# Patient Record
Sex: Male | Born: 1985 | Race: White | Hispanic: No | Marital: Single | Smoking: Never smoker
Health system: Southern US, Community
[De-identification: ages and names within clinical notes are randomized; demographics above are authoritative.]

## PROBLEM LIST (undated history)

## (undated) DIAGNOSIS — K5090 Crohn's disease, unspecified, without complications: Secondary | ICD-10-CM

## (undated) HISTORY — PX: HERNIA REPAIR: SHX51

## (undated) HISTORY — PX: INCISION AND DRAINAGE PERIRECTAL ABSCESS: SHX1804

---
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
## 2008-05-21 ENCOUNTER — Encounter (HOSPITAL_COMMUNITY): Admission: RE | Admit: 2008-05-21 | Discharge: 2008-08-19 | Payer: Self-pay | Admitting: Gastroenterology

## 2011-05-13 NOTE — Op Note (Signed)
Jesse Hanson, HERDA NO.:  [id number]   MEDICAL RECORD NO.:  [id number]          PATIENT TYPE:  EMS   LOCATION:  ED                           FACILITY:  [facility]   PHYSICIAN:  Lorre Munroe., M.D.DATE OF BIRTH:  1986-07-21   DATE OF PROCEDURE:  04/04/2005  DATE OF DISCHARGE:                                 OPERATIVE REPORT   PREOPERATIVE DIAGNOSIS:  Perirectal abscess.   OPERATION/PROCEDURE:  Incision and drainage of perirectal abscess.   SURGEON:  Lebron Conners, M.D.   ANESTHESIA:  General and local.   DESCRIPTION OF PROCEDURE:  After the patient was monitored and anesthetized  and had routine preparation and draping in the perirectal area and did a  good intra-anal and intrarectal exam, felt very little induration along the  rectal wall.  There was a large perirectal abscess on the left side.   I made a radial incision through it going toward the rectum, right through  the middle of the abscess and drained a large amount of pus.  I put my  finger in and broke up ramifications of the abscess to assure complete  drainage.  It went a long way anteriorly and posteriorly although it did not  reach the posterior midline and did not cross the midline anteriorly either.  I made two small counter incisions and pulled a half-inch Penrose drain  through to assure good drainage of the complete cavity.  After getting good  hemostasis and washing the cavity out, I packed it with gauze and applied a  bulky bandage.  I did not attempt to identify or excise any fistula into the  anorectum as I did not feel that there was one present.      WB/MEDQ  D:  04/04/2005  T:  04/05/2005  Job:  [id number]   cc:   Griffith Citron, M.D.  [facility] [place]  [place] [address]  Fax: [phone]

## 2017-05-01 DIAGNOSIS — L718 Other rosacea: Secondary | ICD-10-CM

## 2017-05-01 HISTORY — DX: Other rosacea: L71.8

## 2019-12-31 DIAGNOSIS — K5080 Crohn's disease of both small and large intestine without complications: Secondary | ICD-10-CM | POA: Diagnosis present

## 2020-03-13 ENCOUNTER — Ambulatory Visit: Payer: Self-pay | Attending: Internal Medicine

## 2020-03-13 DIAGNOSIS — Z23 Encounter for immunization: Secondary | ICD-10-CM

## 2020-03-13 NOTE — Progress Notes (Signed)
   Covid-19 Vaccination Clinic  Name:  Jesse Hanson    MRN: [id number] DOB: Apr 27, 1986  03/13/2020  Mr. [name] was observed post Covid-19 immunization for 15 minutes without incident. He was provided with Vaccine Information Sheet and instruction to access the V-Safe system.   Mr. [name] was instructed to call 911 with any severe reactions post vaccine: Marland Kitchen Difficulty breathing  . Swelling of face and throat  . A fast heartbeat  . A bad rash all over body  . Dizziness and weakness   Immunizations Administered    Name Date Dose VIS Date Route   Pfizer COVID-19 Vaccine 03/13/2020 12:24 PM 0.3 mL 12/06/2019 Intramuscular   Manufacturer: [employer], [employer]   Lot: [id number]   NDC: [address]-7

## 2020-04-07 ENCOUNTER — Ambulatory Visit: Payer: Self-pay | Attending: Internal Medicine

## 2020-04-07 DIAGNOSIS — Z23 Encounter for immunization: Secondary | ICD-10-CM

## 2020-04-07 NOTE — Progress Notes (Signed)
   Covid-19 Vaccination Clinic  Name:  Javius Sylla    MRN: [id number] DOB: 1986/10/07  04/07/2020  Mr. Teaster was observed post Covid-19 immunization for 15 minutes without incident. He was provided with Vaccine Information Sheet and instruction to access the V-Safe system.   Mr. Bones was instructed to call 911 with any severe reactions post vaccine: Marland Kitchen Difficulty breathing  . Swelling of face and throat  . A fast heartbeat  . A bad rash all over body  . Dizziness and weakness   Immunizations Administered    Name Date Dose VIS Date Route   Pfizer COVID-19 Vaccine 04/07/2020  1:24 PM 0.3 mL 12/06/2019 Intramuscular   Manufacturer: [employer], [employer]   Lot: [id number]   NDC: [address]-9

## 2021-06-06 ENCOUNTER — Emergency Department (HOSPITAL_BASED_OUTPATIENT_CLINIC_OR_DEPARTMENT_OTHER): Payer: Managed Care, Other (non HMO)

## 2021-06-06 ENCOUNTER — Encounter (HOSPITAL_BASED_OUTPATIENT_CLINIC_OR_DEPARTMENT_OTHER): Payer: Self-pay | Admitting: Obstetrics and Gynecology

## 2021-06-06 ENCOUNTER — Emergency Department (HOSPITAL_BASED_OUTPATIENT_CLINIC_OR_DEPARTMENT_OTHER)
Admission: EM | Admit: 2021-06-06 | Discharge: 2021-06-07 | Disposition: A | Discharge status: Home / Self Care | Payer: Managed Care, Other (non HMO) | Attending: Emergency Medicine | Admitting: Emergency Medicine

## 2021-06-06 ENCOUNTER — Other Ambulatory Visit: Payer: Self-pay

## 2021-06-06 DIAGNOSIS — Z8719 Personal history of other diseases of the digestive system: Secondary | ICD-10-CM

## 2021-06-06 DIAGNOSIS — R030 Elevated blood-pressure reading, without diagnosis of hypertension: Secondary | ICD-10-CM

## 2021-06-06 DIAGNOSIS — R1013 Epigastric pain: Secondary | ICD-10-CM | POA: Diagnosis not present

## 2021-06-06 DIAGNOSIS — R112 Nausea with vomiting, unspecified: Secondary | ICD-10-CM | POA: Insufficient documentation

## 2021-06-06 HISTORY — DX: Crohn's disease, unspecified, without complications: K50.90

## 2021-06-06 LAB — COMPREHENSIVE METABOLIC PANEL
ALT: 71 U/L — ABNORMAL HIGH (ref 0–44)
AST: 49 U/L — ABNORMAL HIGH (ref 15–41)
Albumin: 4.2 g/dL (ref 3.5–5.0)
Alkaline Phosphatase: 60 U/L (ref 38–126)
Anion gap: 12 (ref 5–15)
BUN: 9 mg/dL (ref 6–20)
CO2: 26 mmol/L (ref 22–32)
Calcium: 9.8 mg/dL (ref 8.9–10.3)
Chloride: 101 mmol/L (ref 98–111)
Creatinine, Ser: 1.03 mg/dL (ref 0.61–1.24)
GFR, Estimated: >60 mL/min (ref >60)
Glucose, Bld: 143 mg/dL — ABNORMAL HIGH (ref 70–99)
Potassium: 3.8 mmol/L (ref 3.5–5.1)
Sodium: 139 mmol/L (ref 135–145)
Total Bilirubin: 0.9 mg/dL (ref 0.3–1.2)
Total Protein: 8.1 g/dL (ref 6.5–8.1)

## 2021-06-06 LAB — CBC
HCT: 43.9 % (ref 39.0–52.0)
Hemoglobin: 15.2 g/dL (ref 13.0–17.0)
MCH: 32.3 pg (ref 26.0–34.0)
MCHC: 34.6 g/dL (ref 30.0–36.0)
MCV: 93.4 fL (ref 80.0–100.0)
Platelets: 428 10*3/uL — ABNORMAL HIGH (ref 150–400)
RBC: 4.7 MIL/uL (ref 4.22–5.81)
RDW: 12.9 % (ref 11.5–15.5)
WBC: 13.9 10*3/uL — ABNORMAL HIGH (ref 4.0–10.5)
nRBC: 0 % (ref 0.0–0.2)

## 2021-06-06 LAB — LIPASE, BLOOD: Lipase: 11 U/L (ref 11–51)

## 2021-06-06 MED ORDER — PANTOPRAZOLE SODIUM 40 MG IV SOLR
40.0000 mg | Freq: Once | INTRAVENOUS | Status: AC
  Administered 2021-06-06: 40 mg via INTRAVENOUS
  Filled 2021-06-06: qty 40

## 2021-06-06 MED ORDER — SODIUM CHLORIDE 0.9 % IV BOLUS
1000.0000 mL | Freq: Once | INTRAVENOUS | Status: AC
  Administered 2021-06-06: 1000 mL via INTRAVENOUS

## 2021-06-06 MED ORDER — FENTANYL CITRATE (PF) 100 MCG/2ML IJ SOLN
50.0000 ug | Freq: Once | INTRAMUSCULAR | Status: AC
  Administered 2021-06-06: 50 ug via INTRAVENOUS
  Filled 2021-06-06: qty 2

## 2021-06-06 MED ORDER — ONDANSETRON HCL 4 MG/2ML IJ SOLN
4.0000 mg | Freq: Once | INTRAMUSCULAR | Status: AC
  Administered 2021-06-06: 4 mg via INTRAVENOUS
  Filled 2021-06-06: qty 2

## 2021-06-06 MED ORDER — HYDROMORPHONE HCL 1 MG/ML IJ SOLN
1.0000 mg | Freq: Once | INTRAMUSCULAR | Status: AC
  Administered 2021-06-06: 1 mg via INTRAVENOUS
  Filled 2021-06-06: qty 1

## 2021-06-06 MED ORDER — IOHEXOL 300 MG/ML  SOLN
100.0000 mL | Freq: Once | INTRAMUSCULAR | Status: AC | PRN
  Administered 2021-06-06: 100 mL via INTRAVENOUS

## 2021-06-06 NOTE — Discharge Instructions (Addendum)
It was our pleasure to provide your ER care today - we hope that you feel better.  Drink plenty of fluids/stay well hydrated.  Take zofran as need if nauseated.   Follow up with your doctor in the next 1-2 days if symptoms fail to improve/resolve. Also follow up with your doctor for recheck of blood pressure in the next 1-2 weeks, as it is high tonight.   Return to ER if worse, new symptoms, new, worsening or severe pain, persistent vomiting, high fevers, chest pain, trouble breathing, or other emergency concern.   You were given pain medication in the ER - no driving for the next 6 hours.

## 2021-06-06 NOTE — ED Provider Notes (Signed)
[facility] EMERGENCY DEPT Provider Note   CSN: [id number] Arrival date & time: 06/06/21  2220     History Chief Complaint  Patient presents with   Abdominal Pain    Caven Perine is a 35 y.o. male.  Patient with hx crohn's, c/o epigastric pain and nausea/vomiting. Symptoms acute onset this evening, at rest, moderate, dull, constant, non radiating, w 3-4 episodes nv. Emesis not bloody or bilious. No abd distension. Has been having normal bms. Denies any recent crohn's flare and indicates current symptoms unlike his crohn's. Denies back or flank pain. No chest pain or discomfort. No sob. No cough or uri symptoms. No known ill contacts or bad food ingestion. No fever or chills.   The history is provided by the patient and the EMS personnel.  Abdominal Pain Associated symptoms: nausea and vomiting   Associated symptoms: no chest pain, no cough, no dysuria, no fever, no shortness of breath and no sore throat       Past Medical History:  Diagnosis Date   Crohn disease (HCC)     There are no problems to display for this patient.   Past Surgical History:  Procedure Laterality Date   HERNIA REPAIR Left        History reviewed. No pertinent family history.  Social History   Tobacco Use   Smoking status: Never   Smokeless tobacco: Never  Vaping Use   Vaping Use: Never used  Substance Use Topics   Alcohol use: Yes   Drug use: Never    Home Medications Prior to Admission medications   Not on File    Allergies    Patient has no allergy information on record.  Review of Systems   Review of Systems  Constitutional:  Negative for fever.  HENT:  Negative for sore throat.   Eyes:  Negative for redness.  Respiratory:  Negative for cough and shortness of breath.   Cardiovascular:  Negative for chest pain.  Gastrointestinal:  Positive for abdominal pain, nausea and vomiting.  Genitourinary:  Negative for dysuria and flank pain.  Musculoskeletal:   Negative for back pain and neck pain.  Skin:  Negative for rash.  Neurological:  Negative for headaches.  Hematological:  Does not bruise/bleed easily.  Psychiatric/Behavioral:  Negative for confusion.    Physical Exam Updated Vital Signs Ht 1.803 m (5\' 11" )   Wt 99.8 kg   BMI 30.68 kg/m   Physical Exam Vitals and nursing note reviewed.  Constitutional:      Appearance: Normal appearance. He is well-developed.  HENT:     Head: Atraumatic.     Nose: Nose normal.     Mouth/Throat:     Mouth: Mucous membranes are moist.     Pharynx: Oropharynx is clear.  Eyes:     General: No scleral icterus.    Conjunctiva/sclera: Conjunctivae normal.  Neck:     Trachea: No tracheal deviation.  Cardiovascular:     Rate and Rhythm: Normal rate and regular rhythm.     Pulses: Normal pulses.     Heart sounds: Normal heart sounds. No murmur heard.   No friction rub. No gallop.  Pulmonary:     Effort: Pulmonary effort is normal. No accessory muscle usage or respiratory distress.     Breath sounds: Normal breath sounds.  Abdominal:     General: Bowel sounds are normal. There is no distension.     Palpations: Abdomen is soft. There is no mass.     Tenderness: There  is abdominal tenderness. There is no guarding or rebound.     Hernia: No hernia is present.     Comments: Mild epigastric tenderness.   Genitourinary:    Comments: No cva tenderness. Musculoskeletal:        General: No swelling.     Cervical back: Normal range of motion and neck supple. No rigidity.     Right lower leg: No edema.     Left lower leg: No edema.  Skin:    General: Skin is warm and dry.     Findings: No rash.  Neurological:     Mental Status: He is alert.     Comments: Alert, speech clear.   Psychiatric:        Mood and Affect: Mood normal.    ED Results / Procedures / Treatments   Labs (all labs ordered are listed, but only abnormal results are displayed) Results for orders placed or performed during the  hospital encounter of 06/06/21  CBC  Result Value Ref Range   WBC 13.9 (H) 4.0 - 10.5 K/uL   RBC 4.70 4.22 - 5.81 MIL/uL   Hemoglobin 15.2 13.0 - 17.0 g/dL   HCT 20.1 00.7 - 12.1 %   MCV 93.4 80.0 - 100.0 fL   MCH 32.3 26.0 - 34.0 pg   MCHC 34.6 30.0 - 36.0 g/dL   RDW 97.5 88.3 - 25.4 %   Platelets 428 (H) 150 - 400 K/uL   nRBC 0.0 0.0 - 0.2 %     EKG None  Radiology No results found.  Procedures Procedures   Medications Ordered in ED Medications - No data to display  ED Course  I have reviewed the triage vital signs and the nursing notes.  Pertinent labs & imaging results that were available during my care of the patient were reviewed by me and considered in my medical decision making (see chart for details).    MDM Rules/Calculators/A&P                         Iv ns bolus. Dilaudid iv. Zofran iv. Protonix iv. Labs sent.   Reviewed nursing notes and prior charts for additional history.   Labs reviewed/interpreted by me - hgb normal, wbc 13.  At 2300, signed out to oncoming EDP to check labs when resulted, recheck pt post meds/ivf and po trial, and disposition appropriately.        Final Clinical Impression(s) / ED Diagnoses Final diagnoses:  None    Rx / DC Orders ED Discharge Orders     None        Cathren Laine, MD 06/06/21 2251

## 2021-06-06 NOTE — ED Triage Notes (Signed)
Patient reports to the ER via EMS for N/V. Patient reports he thought it was food poisoning but is not feeling better. Patient reports pain with movement. Hx of Chron's and hernia

## 2021-06-07 NOTE — ED Provider Notes (Signed)
35 year old male with history of Crohn's disease, umbilical hernia repair presents to ER with concern for abdominal pain.  Isolated to the epigastrium.  Some associated nausea but no vomiting.  Vital stable.  Provided fluids, Protonix, Zofran, Dilaudid.  11:00 PM Received signout from Steinl  11:30 PM reassessed patient.  Pain had significantly improved but now is coming back.  Does have some tenderness in his abdomen but no rebound or guarding.  Given ongoing pain and white count, recommended checking CT scan to evaluate for acute abdominal process.  Provided additional dose of pain meds.  12:25 AM reviewed CT results, no acute process identified.  Patient denies ongoing pain.  Given reassuring work-up and current clinical appearance, believe patient appropriate for discharge and outpatient management.  Recommend he follow-up with his primary care or gastroenterologist closely.   Milagros Loll, MD 06/07/21 [phone]

## 2021-06-10 ENCOUNTER — Inpatient Hospital Stay (HOSPITAL_BASED_OUTPATIENT_CLINIC_OR_DEPARTMENT_OTHER)
Admission: EM | Admit: 2021-06-10 | Discharge: 2021-06-14 | DRG: 418 | Disposition: A | Discharge status: Home / Self Care | Payer: Managed Care, Other (non HMO) | Attending: Family Medicine | Admitting: Family Medicine

## 2021-06-10 ENCOUNTER — Encounter (HOSPITAL_BASED_OUTPATIENT_CLINIC_OR_DEPARTMENT_OTHER): Payer: Self-pay | Admitting: Obstetrics and Gynecology

## 2021-06-10 ENCOUNTER — Other Ambulatory Visit: Payer: Self-pay

## 2021-06-10 DIAGNOSIS — K2970 Gastritis, unspecified, without bleeding: Secondary | ICD-10-CM | POA: Diagnosis present

## 2021-06-10 DIAGNOSIS — E669 Obesity, unspecified: Secondary | ICD-10-CM | POA: Diagnosis present

## 2021-06-10 DIAGNOSIS — K8063 Calculus of gallbladder and bile duct with acute cholecystitis with obstruction: Principal | ICD-10-CM | POA: Diagnosis present

## 2021-06-10 DIAGNOSIS — R1011 Right upper quadrant pain: Secondary | ICD-10-CM | POA: Diagnosis not present

## 2021-06-10 DIAGNOSIS — Z20822 Contact with and (suspected) exposure to covid-19: Secondary | ICD-10-CM | POA: Diagnosis present

## 2021-06-10 DIAGNOSIS — K759 Inflammatory liver disease, unspecified: Secondary | ICD-10-CM | POA: Diagnosis present

## 2021-06-10 DIAGNOSIS — K760 Fatty (change of) liver, not elsewhere classified: Secondary | ICD-10-CM | POA: Diagnosis present

## 2021-06-10 DIAGNOSIS — F1010 Alcohol abuse, uncomplicated: Secondary | ICD-10-CM | POA: Diagnosis present

## 2021-06-10 DIAGNOSIS — L718 Other rosacea: Secondary | ICD-10-CM | POA: Diagnosis present

## 2021-06-10 DIAGNOSIS — K810 Acute cholecystitis: Secondary | ICD-10-CM | POA: Diagnosis present

## 2021-06-10 DIAGNOSIS — K5080 Crohn's disease of both small and large intestine without complications: Secondary | ICD-10-CM | POA: Diagnosis present

## 2021-06-10 DIAGNOSIS — R109 Unspecified abdominal pain: Secondary | ICD-10-CM

## 2021-06-10 DIAGNOSIS — K8001 Calculus of gallbladder with acute cholecystitis with obstruction: Secondary | ICD-10-CM | POA: Diagnosis present

## 2021-06-10 DIAGNOSIS — Z419 Encounter for procedure for purposes other than remedying health state, unspecified: Secondary | ICD-10-CM

## 2021-06-10 DIAGNOSIS — K82A1 Gangrene of gallbladder in cholecystitis: Secondary | ICD-10-CM | POA: Diagnosis present

## 2021-06-10 DIAGNOSIS — D849 Immunodeficiency, unspecified: Secondary | ICD-10-CM | POA: Diagnosis present

## 2021-06-10 DIAGNOSIS — D509 Iron deficiency anemia, unspecified: Secondary | ICD-10-CM | POA: Diagnosis present

## 2021-06-10 DIAGNOSIS — I10 Essential (primary) hypertension: Secondary | ICD-10-CM | POA: Diagnosis present

## 2021-06-10 LAB — BASIC METABOLIC PANEL
Anion gap: 10 (ref 5–15)
BUN: 10 mg/dL (ref 6–20)
CO2: 28 mmol/L (ref 22–32)
Calcium: 9.9 mg/dL (ref 8.9–10.3)
Chloride: 100 mmol/L (ref 98–111)
Creatinine, Ser: 0.82 mg/dL (ref 0.61–1.24)
GFR, Estimated: >60 mL/min (ref >60)
Glucose, Bld: 98 mg/dL (ref 70–99)
Potassium: 3.6 mmol/L (ref 3.5–5.1)
Sodium: 138 mmol/L (ref 135–145)

## 2021-06-10 LAB — CBC
HCT: 41.5 % (ref 39.0–52.0)
Hemoglobin: 14.5 g/dL (ref 13.0–17.0)
MCH: 32.6 pg (ref 26.0–34.0)
MCHC: 34.9 g/dL (ref 30.0–36.0)
MCV: 93.3 fL (ref 80.0–100.0)
Platelets: 396 10*3/uL (ref 150–400)
RBC: 4.45 MIL/uL (ref 4.22–5.81)
RDW: 12.9 % (ref 11.5–15.5)
WBC: 13.5 10*3/uL — ABNORMAL HIGH (ref 4.0–10.5)
nRBC: 0 % (ref 0.0–0.2)

## 2021-06-10 LAB — HEPATIC FUNCTION PANEL
ALT: 29 U/L (ref 0–44)
AST: 39 U/L (ref 15–41)
Albumin: 4.2 g/dL (ref 3.5–5.0)
Alkaline Phosphatase: 81 U/L (ref 38–126)
Bilirubin, Direct: 0.5 mg/dL — ABNORMAL HIGH (ref 0.0–0.2)
Indirect Bilirubin: 0.8 mg/dL (ref 0.3–0.9)
Total Bilirubin: 1.3 mg/dL — ABNORMAL HIGH (ref 0.3–1.2)
Total Protein: 8.2 g/dL — ABNORMAL HIGH (ref 6.5–8.1)

## 2021-06-10 MED ORDER — HYDROMORPHONE HCL 1 MG/ML IJ SOLN
1.0000 mg | Freq: Once | INTRAMUSCULAR | Status: AC
  Administered 2021-06-10: 1 mg via INTRAVENOUS
  Filled 2021-06-10: qty 1

## 2021-06-10 MED ORDER — ONDANSETRON HCL 4 MG/2ML IJ SOLN
4.0000 mg | Freq: Once | INTRAMUSCULAR | Status: AC
  Administered 2021-06-10: 4 mg via INTRAVENOUS
  Filled 2021-06-10: qty 2

## 2021-06-10 MED ORDER — PIPERACILLIN-TAZOBACTAM 3.375 G IVPB 30 MIN
3.3750 g | Freq: Once | INTRAVENOUS | Status: AC
  Administered 2021-06-10: 3.375 g via INTRAVENOUS
  Filled 2021-06-10: qty 50

## 2021-06-10 NOTE — ED Triage Notes (Signed)
Patient reports to the ER for right sided flank pain. Patient reports he feels nauseated but has had no emesis. Patient reports his GI was concerned for a stone in the gallbladder that is moving

## 2021-06-10 NOTE — ED Provider Notes (Signed)
[facility] EMERGENCY DEPT Provider Note   CSN: [id number] Arrival date & time: 06/10/21  2238     History Chief Complaint  Patient presents with   Flank Pain   Nausea    Jesse Hanson is a 35 y.o. male.  Patient presents to the emergency department for evaluation of right-sided abdominal pain.  Patient reports that symptoms have been ongoing for several days.  Patient was seen in this emergency department on June 12.  He felt better after the pain medication but the pain never really went away.  Pain has slowly intensified.  Was seen by his GI doctor today and had an outpatient ultrasound.  Was referred to the ER based on the ultrasound results.      Past Medical History:  Diagnosis Date   Crohn disease (HCC)     There are no problems to display for this patient.   Past Surgical History:  Procedure Laterality Date   HERNIA REPAIR Left        No family history on file.  Social History   Tobacco Use   Smoking status: Never   Smokeless tobacco: Never  Vaping Use   Vaping Use: Never used  Substance Use Topics   Alcohol use: Yes    Alcohol/week: 70.0 standard drinks    Types: 70 Standard drinks or equivalent per week   Drug use: Never    Home Medications Prior to Admission medications   Not on File    Allergies    Patient has no allergy information on record.  Review of Systems   Review of Systems  Gastrointestinal:  Positive for abdominal pain.  All other systems reviewed and are negative.  Physical Exam Updated Vital Signs BP (!) 152/105 (BP Location: Right Arm)   Pulse 91   Temp 98.5 F (36.9 C) (Oral)   Resp (!) 23   SpO2 100%   Physical Exam Vitals and nursing note reviewed.  Constitutional:      General: He is not in acute distress.    Appearance: Normal appearance. He is well-developed.  HENT:     Head: Normocephalic and atraumatic.     Right Ear: Hearing normal.     Left Ear: Hearing normal.     Nose: Nose  normal.  Eyes:     Conjunctiva/sclera: Conjunctivae normal.     Pupils: Pupils are equal, round, and reactive to light.  Cardiovascular:     Rate and Rhythm: Regular rhythm.     Heart sounds: S1 normal and S2 normal. No murmur heard.   No friction rub. No gallop.  Pulmonary:     Effort: Pulmonary effort is normal. No respiratory distress.     Breath sounds: Normal breath sounds.  Chest:     Chest wall: No tenderness.  Abdominal:     General: Bowel sounds are normal.     Palpations: Abdomen is soft.     Tenderness: There is abdominal tenderness in the right upper quadrant. There is guarding. There is no rebound. Negative signs include Murphy's sign and McBurney's sign.     Hernia: No hernia is present.  Musculoskeletal:        General: Normal range of motion.     Cervical back: Normal range of motion and neck supple.  Skin:    General: Skin is warm and dry.     Findings: No rash.  Neurological:     Mental Status: He is alert and oriented to person, place, and time.  GCS: GCS eye subscore is 4. GCS verbal subscore is 5. GCS motor subscore is 6.     Cranial Nerves: No cranial nerve deficit.     Sensory: No sensory deficit.     Coordination: Coordination normal.  Psychiatric:        Speech: Speech normal.        Behavior: Behavior normal.        Thought Content: Thought content normal.    ED Results / Procedures / Treatments   Labs (all labs ordered are listed, but only abnormal results are displayed) Labs Reviewed  CBC - Abnormal; Notable for the following components:      Result Value   WBC 13.5 (*)    All other components within normal limits  BASIC METABOLIC PANEL  URINALYSIS, ROUTINE W REFLEX MICROSCOPIC  HEPATIC FUNCTION PANEL    EKG None  Radiology No results found.  Procedures Procedures   Medications Ordered in ED Medications  piperacillin-tazobactam (ZOSYN) IVPB 3.375 g (3.375 g Intravenous New Bag/Given 06/10/21 2337)  HYDROmorphone (DILAUDID)  injection 1 mg (1 mg Intravenous Given 06/10/21 2317)  ondansetron (ZOFRAN) injection 4 mg (4 mg Intravenous Given 06/10/21 2316)    ED Course  I have reviewed the triage vital signs and the nursing notes.  Pertinent labs & imaging results that were available during my care of the patient were reviewed by me and considered in my medical decision making (see chart for details).    MDM Rules/Calculators/A&P                         Patient presents with persistent abdominal pain.  Was seen in this emergency department several days ago, had a CT that did not show any pathology.  Patient appropriately followed up today with gastroenterology at [facility] and had an outpatient ultrasound that showed:  1.  Cholelithiasis with mild thickening of the gallbladder wall. If there is clinical concern for acute cholecystitis, a nuclear medicine HIDA scan could evaluate for patency of the cystic duct.  2.  Mild dilatation of the common bile duct in this patient with mild elevation of the serum bilirubin, raising concern for a possible obstructing stone in the common bile duct. MRCP and/or ERCP could provide further evaluation.  3.  Hepatomegaly and findings suggestive of hepatic steatosis.    Patient with persistently elevated white blood cell count.  Patient has slightly elevated total bilirubin today.  He does not appear ill but suspect that he has acute cholecystitis and might have choledocholithiasis.  Empirically treated with Zosyn and will admit for further work-up.  Final Clinical Impression(s) / ED Diagnoses Final diagnoses:  None    Rx / DC Orders ED Discharge Orders     None        Ahlana Slaydon, Canary Brim, MD 06/11/21 0007

## 2021-06-11 ENCOUNTER — Observation Stay (HOSPITAL_COMMUNITY): Payer: Managed Care, Other (non HMO)

## 2021-06-11 ENCOUNTER — Encounter (HOSPITAL_COMMUNITY): Payer: Self-pay | Admitting: Family Medicine

## 2021-06-11 DIAGNOSIS — R932 Abnormal findings on diagnostic imaging of liver and biliary tract: Secondary | ICD-10-CM

## 2021-06-11 DIAGNOSIS — F1010 Alcohol abuse, uncomplicated: Secondary | ICD-10-CM | POA: Diagnosis not present

## 2021-06-11 DIAGNOSIS — K760 Fatty (change of) liver, not elsewhere classified: Secondary | ICD-10-CM | POA: Diagnosis not present

## 2021-06-11 DIAGNOSIS — Z20822 Contact with and (suspected) exposure to covid-19: Secondary | ICD-10-CM | POA: Diagnosis not present

## 2021-06-11 DIAGNOSIS — K8010 Calculus of gallbladder with chronic cholecystitis without obstruction: Secondary | ICD-10-CM

## 2021-06-11 DIAGNOSIS — K8063 Calculus of gallbladder and bile duct with acute cholecystitis with obstruction: Secondary | ICD-10-CM | POA: Diagnosis not present

## 2021-06-11 DIAGNOSIS — K838 Other specified diseases of biliary tract: Secondary | ICD-10-CM

## 2021-06-11 DIAGNOSIS — E669 Obesity, unspecified: Secondary | ICD-10-CM | POA: Diagnosis not present

## 2021-06-11 DIAGNOSIS — I10 Essential (primary) hypertension: Secondary | ICD-10-CM | POA: Diagnosis not present

## 2021-06-11 DIAGNOSIS — R1011 Right upper quadrant pain: Secondary | ICD-10-CM

## 2021-06-11 DIAGNOSIS — D509 Iron deficiency anemia, unspecified: Secondary | ICD-10-CM | POA: Diagnosis not present

## 2021-06-11 DIAGNOSIS — K5080 Crohn's disease of both small and large intestine without complications: Secondary | ICD-10-CM | POA: Diagnosis not present

## 2021-06-11 DIAGNOSIS — K2970 Gastritis, unspecified, without bleeding: Secondary | ICD-10-CM | POA: Diagnosis not present

## 2021-06-11 DIAGNOSIS — R945 Abnormal results of liver function studies: Secondary | ICD-10-CM

## 2021-06-11 DIAGNOSIS — K82A1 Gangrene of gallbladder in cholecystitis: Secondary | ICD-10-CM | POA: Diagnosis not present

## 2021-06-11 DIAGNOSIS — D849 Immunodeficiency, unspecified: Secondary | ICD-10-CM | POA: Diagnosis not present

## 2021-06-11 DIAGNOSIS — K759 Inflammatory liver disease, unspecified: Secondary | ICD-10-CM | POA: Diagnosis not present

## 2021-06-11 LAB — CBC WITH DIFFERENTIAL/PLATELET
Abs Immature Granulocytes: 0.04 10*3/uL (ref 0.00–0.07)
Basophils Absolute: 0 10*3/uL (ref 0.0–0.1)
Basophils Relative: 0 %
Eosinophils Absolute: 0.1 10*3/uL (ref 0.0–0.5)
Eosinophils Relative: 1 %
HCT: 37.7 % — ABNORMAL LOW (ref 39.0–52.0)
Hemoglobin: 12.9 g/dL — ABNORMAL LOW (ref 13.0–17.0)
Immature Granulocytes: 0 %
Lymphocytes Relative: 11 %
Lymphs Abs: 1 10*3/uL (ref 0.7–4.0)
MCH: 32.9 pg (ref 26.0–34.0)
MCHC: 34.2 g/dL (ref 30.0–36.0)
MCV: 96.2 fL (ref 80.0–100.0)
Monocytes Absolute: 1 10*3/uL (ref 0.1–1.0)
Monocytes Relative: 10 %
Neutro Abs: 7.5 10*3/uL (ref 1.7–7.7)
Neutrophils Relative %: 78 %
Platelets: 322 10*3/uL (ref 150–400)
RBC: 3.92 MIL/uL — ABNORMAL LOW (ref 4.22–5.81)
RDW: 13.1 % (ref 11.5–15.5)
WBC: 9.7 10*3/uL (ref 4.0–10.5)
nRBC: 0 % (ref 0.0–0.2)

## 2021-06-11 LAB — COMPREHENSIVE METABOLIC PANEL
ALT: 160 U/L — ABNORMAL HIGH (ref 0–44)
AST: 271 U/L — ABNORMAL HIGH (ref 15–41)
Albumin: 2.7 g/dL — ABNORMAL LOW (ref 3.5–5.0)
Alkaline Phosphatase: 110 U/L (ref 38–126)
Anion gap: 8 (ref 5–15)
BUN: 5 mg/dL — ABNORMAL LOW (ref 6–20)
CO2: 26 mmol/L (ref 22–32)
Calcium: 8.6 mg/dL — ABNORMAL LOW (ref 8.9–10.3)
Chloride: 104 mmol/L (ref 98–111)
Creatinine, Ser: 0.8 mg/dL (ref 0.61–1.24)
GFR, Estimated: >60 mL/min (ref >60)
Glucose, Bld: 102 mg/dL — ABNORMAL HIGH (ref 70–99)
Potassium: 3.7 mmol/L (ref 3.5–5.1)
Sodium: 138 mmol/L (ref 135–145)
Total Bilirubin: 3.1 mg/dL — ABNORMAL HIGH (ref 0.3–1.2)
Total Protein: 6.4 g/dL — ABNORMAL LOW (ref 6.5–8.1)

## 2021-06-11 LAB — URINALYSIS, ROUTINE W REFLEX MICROSCOPIC
Bilirubin Urine: NEGATIVE
Glucose, UA: NEGATIVE mg/dL
Hgb urine dipstick: NEGATIVE
Ketones, ur: NEGATIVE mg/dL
Leukocytes,Ua: NEGATIVE
Nitrite: NEGATIVE
Specific Gravity, Urine: 1.024 (ref 1.005–1.030)
pH: 6.5 (ref 5.0–8.0)

## 2021-06-11 LAB — RESP PANEL BY RT-PCR (FLU A&B, COVID) ARPGX2
Influenza A by PCR: NEGATIVE
Influenza B by PCR: NEGATIVE
SARS Coronavirus 2 by RT PCR: NEGATIVE

## 2021-06-11 LAB — PROTIME-INR
INR: 1.1 (ref 0.8–1.2)
Prothrombin Time: 14.3 seconds (ref 11.4–15.2)

## 2021-06-11 LAB — HIV ANTIBODY (ROUTINE TESTING W REFLEX): HIV Screen 4th Generation wRfx: NONREACTIVE

## 2021-06-11 LAB — APTT: aPTT: 29 seconds (ref 24–36)

## 2021-06-11 MED ORDER — PIPERACILLIN-TAZOBACTAM 3.375 G IVPB
3.3750 g | Freq: Three times a day (TID) | INTRAVENOUS | Status: DC
  Administered 2021-06-11 – 2021-06-14 (×11): 3.375 g via INTRAVENOUS
  Filled 2021-06-11 (×11): qty 50

## 2021-06-11 MED ORDER — HYDROMORPHONE HCL 1 MG/ML IJ SOLN
1.0000 mg | Freq: Once | INTRAMUSCULAR | Status: AC
  Administered 2021-06-11: 1 mg via INTRAVENOUS
  Filled 2021-06-11: qty 1

## 2021-06-11 MED ORDER — HYDROMORPHONE HCL 1 MG/ML IJ SOLN
0.5000 mg | INTRAMUSCULAR | Status: DC | PRN
  Administered 2021-06-11: 0.5 mg via INTRAVENOUS
  Filled 2021-06-11: qty 1

## 2021-06-11 MED ORDER — HYDROMORPHONE HCL 1 MG/ML IJ SOLN
1.0000 mg | INTRAMUSCULAR | Status: DC | PRN
  Administered 2021-06-11 – 2021-06-12 (×3): 1 mg via INTRAVENOUS
  Filled 2021-06-11 (×3): qty 1

## 2021-06-11 MED ORDER — ONDANSETRON HCL 4 MG/2ML IJ SOLN
4.0000 mg | Freq: Four times a day (QID) | INTRAMUSCULAR | Status: DC | PRN
  Administered 2021-06-11 – 2021-06-13 (×6): 4 mg via INTRAVENOUS
  Filled 2021-06-11 (×6): qty 2

## 2021-06-11 MED ORDER — GADOBUTROL 1 MMOL/ML IV SOLN
10.0000 mL | Freq: Once | INTRAVENOUS | Status: AC | PRN
  Administered 2021-06-11: 10 mL via INTRAVENOUS

## 2021-06-11 MED ORDER — TECHNETIUM TC 99M MEBROFENIN IV KIT
7.7000 | PACK | Freq: Once | INTRAVENOUS | Status: AC | PRN
  Administered 2021-06-11: 7.7 via INTRAVENOUS

## 2021-06-11 MED ORDER — LACTATED RINGERS IV SOLN: INTRAVENOUS | Status: AC

## 2021-06-11 MED ORDER — AZATHIOPRINE 50 MG PO TABS
100.0000 mg | ORAL_TABLET | Freq: Every day | ORAL | Status: DC
  Administered 2021-06-12 – 2021-06-14 (×3): 100 mg via ORAL
  Filled 2021-06-11 (×4): qty 2

## 2021-06-11 MED ORDER — ACETAMINOPHEN 650 MG RE SUPP: 650.0000 mg | Freq: Four times a day (QID) | RECTAL | Status: DC | PRN

## 2021-06-11 MED ORDER — LACTATED RINGERS IV BOLUS
500.0000 mL | Freq: Once | INTRAVENOUS | Status: AC
  Administered 2021-06-11: 500 mL via INTRAVENOUS

## 2021-06-11 MED ORDER — ACETAMINOPHEN 325 MG PO TABS: 650.0000 mg | ORAL_TABLET | Freq: Four times a day (QID) | ORAL | Status: DC | PRN

## 2021-06-11 NOTE — Progress Notes (Signed)
HOSPITAL MEDICINE OVERNIGHT EVENT NOTE    Results of HIDA scan discussed with Dr. Eppie Gibson.    Unfortunately, the cystic duct patency could not be assessed on the exam along with complete obstruction of the common bile duct.  These findings in combination with MRCP findings earlier today suggest that the gallbladder is so edematous that it is causing a common bile duct/cystic duct obstruction in the absence of a stone all concerning for significant acute cholecystitis.  Chart reviewed, general surgery is currently following with the patient and is tentatively planning on taking the patient for laparoscopic cholecystectomy tomorrow.  Continue current plan.   Marinda Elk  MD [facility]

## 2021-06-11 NOTE — TOC Initial Note (Signed)
Transition of Care [facility]) - Initial/Assessment Note    Patient Details  Name: Jesse Hanson MRN: [id number] Date of Birth: [birth year]  Transition of [facility]) CM/SW Contact:    Lockie Pares, RN Phone Number: 06/11/2021, 9:26 AM  Clinical Narrative:                  35 Yo admitted with abdominal pain possible gallbladder issues initial CT abdomen revealed fatty liver, small fat infiltrated hernia. Going for other testing today. No needs identified post DC, at this time.   Expected Discharge Plan: Home/Self Care Barriers to Discharge: Continued Medical Work up   Patient Goals and CMS Choice        Expected Discharge Plan and Services Expected Discharge Plan: Home/Self Care       Living arrangements for the past 2 months: Apartment                                      Prior Living Arrangements/Services Living arrangements for the past 2 months: Apartment Lives with:: Spouse Patient language and need for interpreter reviewed:: Yes        Need for Family Participation in Patient Care: Yes (Comment) Care giver support system in place?: Yes (comment)   Criminal Activity/Legal Involvement Pertinent to Current Situation/Hospitalization: No - Comment as needed  Activities of Daily Living      Permission Sought/Granted                  Emotional Assessment       Orientation: : Oriented to Self, Oriented to Place, Oriented to  Time, Oriented to Situation Alcohol / Substance Use: Not Applicable Psych Involvement: No (comment)  Admission diagnosis:  Right upper quadrant pain [R10.11] Right upper quadrant abdominal pain [R10.11] Patient Active Problem List   Diagnosis Date Noted   Right upper quadrant pain 06/11/2021   Crohn's disease of both small and large intestine without complications (HCC) 12/31/2019   Other rosacea 05/01/2017   PCP:  Gaspar Garbe, MD Pharmacy:  No Pharmacies Listed    Social Determinants of Health (SDOH)  Interventions    Readmission Risk Interventions No flowsheet data found.

## 2021-06-11 NOTE — Consult Note (Signed)
Consult Note  Jesse Hanson [facility] 1986/09/29  [id number].    Requesting MD: Jennye Moccasin PA-C Chief Complaint/Reason for Consult: symptomatic cholelithiasis, possible choledocolithiasis  HPI:  35 yo male with medical history of Chron's disease who presented to [facility] on 6/12 and again on 6/15 for abdominal pain, nausea, vomiting. Work up on 6/12 with CT did not show acute Chron's disease or obstructive process. He followed up with GI after that ED visit and had an outpatient [place] which showed cholelithiasis, mild GB wall thickening. 7 mm CBD.  No intrahepatic ductal dilatation.  Incomplete visualization pancreas.  Hepatomegaly and increased liver echo suggestive of fatty liver.  Liver contour smooth.  Initial symptoms began a few hours after eating tacos. Symptoms returned last night after eating pizza prompting his presentation to the ED. He has not had fever or chills at home. No blood in emesis. Has been NPO today and all symptoms including abdominal pain have improved. Pain greater in epigastrium than in RUQ. Work up in the ED significant for T bili 1.3 >> 3.1.  AST/ALT 39/29 >> 271/160.  Alkaline phosphatase normal at 110.    His Chron's disease has been well controlled on azathioprine and Stelara every 2 months. Bowel movements have been normal nonbloody with last BM yesterday.   He drinks alcohol heavily - drinking 9 beers about every other day alternating with rum up to a gallon in 5 days. He does not drink daily and has not had alcohol since Sunday. He is trying to cut down. He has never had withdrawal symptoms before.   Substance use: alcohol as above. Denies tobacco use. Allergies: NKDA Blood thinners: none Past Surgeries: umbilical hernia repair with mesh 02/26/21 at [facility]   ROS: Review of Systems  Constitutional:  Negative for chills and fever.  Respiratory:  Negative for shortness of breath.   Cardiovascular:  Negative for chest pain and leg swelling.   Gastrointestinal:  Positive for abdominal pain, nausea and vomiting. Negative for blood in stool, constipation, diarrhea and melena.  Genitourinary: Negative.   Psychiatric/Behavioral:  The patient is not nervous/anxious.    Family History  Problem Relation Age of Onset   Heart disease Neg Hx     Past Medical History:  Diagnosis Date   Crohn disease (HCC)    Other rosacea 05/01/2017    Past Surgical History:  Procedure Laterality Date   HERNIA REPAIR Left     Social History:  reports that he has never smoked. He has never used smokeless tobacco. He reports current alcohol use of about 70.0 standard drinks of alcohol per week. He reports that he does not use drugs.  Allergies: No Known Allergies  Medications Prior to Admission  Medication Sig Dispense Refill   azaTHIOprine (IMURAN) 50 MG tablet Take 100 mg by mouth daily.     Cholecalciferol 25 MCG (1000 UT) tablet Take 1 tablet by mouth daily.     ferrous sulfate 325 (65 FE) MG tablet Take 325 mg by mouth daily.     STELARA 90 MG/ML SOSY injection Inject 1 mL into the skin every 8 (eight) weeks.      Blood pressure (!) 142/97, pulse 73, temperature 98.8 F (37.1 C), temperature source Oral, resp. rate 18, SpO2 99 %. Physical Exam:  General: pleasant, WD, male who is laying in bed in NAD HEENT: head is normocephalic, atraumatic.  Sclera are noninjected.  Pupils equal and round.  Ears and nose without any masses or lesions.  Mouth is pink and moist Heart: regular, rate, and rhythm.  Normal s1,s2. No obvious murmurs, gallops, or rubs noted.  Palpable radial and pedal pulses bilaterally Lungs: CTAB, no wheezes, rhonchi, or rales noted.  Respiratory effort nonlabored Abd: soft, ND, +BS, TTP over epigastrium and RUQ. Epigastrium > RUQ. - Murphy's sign. No guarding or rebound. Well healed lap incision scars. No hernias or organomegaly. MS: all 4 extremities are symmetrical with no cyanosis, clubbing, or edema. Skin: warm and dry  with no masses, lesions, or rashes Neuro: Cranial nerves 2-12 grossly intact, sensation is normal throughout Psych: A&Ox3 with an appropriate affect.   Results for orders placed or performed during the hospital encounter of 06/10/21 (from the past 48 hour(s))  Basic metabolic panel     Status: None   Collection Time: 06/10/21 10:52 PM  Result Value Ref Range   Sodium 138 135 - 145 mmol/L   Potassium 3.6 3.5 - 5.1 mmol/L   Chloride 100 98 - 111 mmol/L   CO2 28 22 - 32 mmol/L   Glucose, Bld 98 70 - 99 mg/dL    Comment: Glucose reference range applies only to samples taken after fasting for at least 8 hours.   BUN 10 6 - 20 mg/dL   Creatinine, Ser 1.610.82 0.61 - 1.24 mg/dL   Calcium 9.9 8.9 - 09.610.3 mg/dL   GFR, Estimated >04>60 >54>60 mL/min    Comment: (NOTE) Calculated using the CKD-EPI Creatinine Equation (2021)    Anion gap 10 5 - 15    Comment: Performed at [facility], [address] [address], [place], [place] [address]  CBC     Status: Abnormal   Collection Time: 06/10/21 10:52 PM  Result Value Ref Range   WBC 13.5 (H) 4.0 - 10.5 K/uL   RBC 4.45 4.22 - 5.81 MIL/uL   Hemoglobin 14.5 13.0 - 17.0 g/dL   HCT 91.441.5 78.239.0 - 95.652.0 %   MCV 93.3 80.0 - 100.0 fL   MCH 32.6 26.0 - 34.0 pg   MCHC 34.9 30.0 - 36.0 g/dL   RDW 21.312.9 08.611.5 - 57.815.5 %   Platelets 396 150 - 400 K/uL   nRBC 0.0 0.0 - 0.2 %    Comment: Performed at [facility], [address], [place], [place] [address]  Hepatic function panel     Status: Abnormal   Collection Time: 06/10/21 10:52 PM  Result Value Ref Range   Total Protein 8.2 (H) 6.5 - 8.1 g/dL   Albumin 4.2 3.5 - 5.0 g/dL   AST 39 15 - 41 U/L   ALT 29 0 - 44 U/L   Alkaline Phosphatase 81 38 - 126 U/L   Total Bilirubin 1.3 (H) 0.3 - 1.2 mg/dL   Bilirubin, Direct 0.5 (H) 0.0 - 0.2 mg/dL   Indirect Bilirubin 0.8 0.3 - 0.9 mg/dL    Comment: Performed at [facility], [address], [place], [place] [address]  Resp  Panel by RT-PCR (Flu A&B, Covid) Nasopharyngeal Swab     Status: None   Collection Time: 06/11/21 12:10 AM   Specimen: Nasopharyngeal Swab; Nasopharyngeal(NP) swabs in vial transport medium  Result Value Ref Range   SARS Coronavirus 2 by RT PCR NEGATIVE NEGATIVE    Comment: (NOTE) SARS-CoV-2 target nucleic acids are NOT DETECTED.  The SARS-CoV-2 RNA is generally detectable in upper respiratory specimens during the acute phase of infection. The lowest concentration of SARS-CoV-2 viral copies this assay can detect is 138 copies/mL. A negative result does  not preclude SARS-Cov-2 infection and should not be used as the sole basis for treatment or other patient management decisions. A negative result may occur with  improper specimen collection/handling, submission of specimen other than nasopharyngeal swab, presence of viral mutation(s) within the areas targeted by this assay, and inadequate number of viral copies(<138 copies/mL). A negative result must be combined with clinical observations, patient history, and epidemiological information. The expected result is Negative.  Fact Sheet for Patients:  BloggerCourse.com  Fact Sheet for Healthcare Providers:  SeriousBroker.it  This test is no t yet approved or cleared by the [place] FDA and  has been authorized for detection and/or diagnosis of SARS-CoV-2 by FDA under an Emergency Use Authorization (EUA). This EUA will remain  in effect (meaning this test can be used) for the duration of the COVID-19 declaration under Section 564(b)(1) of the Act, 21 U.S.C.section 360bbb-3(b)(1), unless the authorization is terminated  or revoked sooner.       Influenza A by PCR NEGATIVE NEGATIVE   Influenza B by PCR NEGATIVE NEGATIVE    Comment: (NOTE) The Xpert Xpress SARS-CoV-2/FLU/RSV plus assay is intended as an aid in the diagnosis of influenza from Nasopharyngeal swab specimens  and should not be used as a sole basis for treatment. Nasal washings and aspirates are unacceptable for Xpert Xpress SARS-CoV-2/FLU/RSV testing.  Fact Sheet for Patients: BloggerCourse.com  Fact Sheet for Healthcare Providers: SeriousBroker.it  This test is not yet approved or cleared by the [place] FDA and has been authorized for detection and/or diagnosis of SARS-CoV-2 by FDA under an Emergency Use Authorization (EUA). This EUA will remain in effect (meaning this test can be used) for the duration of the COVID-19 declaration under Section 564(b)(1) of the Act, 21 U.S.C. section 360bbb-3(b)(1), unless the authorization is terminated or revoked.  Performed at [facility], [address], [place], [place] [address]   Urinalysis, Routine w reflex microscopic Urine, Clean Catch     Status: Abnormal   Collection Time: 06/11/21  2:05 AM  Result Value Ref Range   Color, Urine YELLOW YELLOW   APPearance HAZY (A) CLEAR   Specific Gravity, Urine 1.024 1.005 - 1.030   pH 6.5 5.0 - 8.0   Glucose, UA NEGATIVE NEGATIVE mg/dL   Hgb urine dipstick NEGATIVE NEGATIVE   Bilirubin Urine NEGATIVE NEGATIVE   Ketones, ur NEGATIVE NEGATIVE mg/dL   Protein, ur TRACE (A) NEGATIVE mg/dL   Nitrite NEGATIVE NEGATIVE   Leukocytes,Ua NEGATIVE NEGATIVE   WBC, UA 0-5 0 - 5 WBC/hpf   Bacteria, UA RARE (A) NONE SEEN   Mucus PRESENT    Ca Oxalate Crys, UA PRESENT     Comment: Performed at [facility], [address], [place], [place] [address]  HIV Antibody (routine testing w rflx)     Status: None   Collection Time: 06/11/21  7:11 AM  Result Value Ref Range   HIV Screen 4th Generation wRfx Non Reactive Non Reactive    Comment: Performed at [facility], [address] [place], [place] [address]  CBC WITH DIFFERENTIAL     Status: Abnormal   Collection Time: 06/11/21  7:11 AM  Result Value Ref Range    WBC 9.7 4.0 - 10.5 K/uL   RBC 3.92 (L) 4.22 - 5.81 MIL/uL   Hemoglobin 12.9 (L) 13.0 - 17.0 g/dL   HCT 84.1 (L) 32.4 - 40.1 %   MCV 96.2 80.0 - 100.0 fL   MCH 32.9 26.0 - 34.0 pg  MCHC 34.2 30.0 - 36.0 g/dL   RDW 02.5 42.7 - 06.2 %   Platelets 322 150 - 400 K/uL   nRBC 0.0 0.0 - 0.2 %   Neutrophils Relative % 78 %   Neutro Abs 7.5 1.7 - 7.7 K/uL   Lymphocytes Relative 11 %   Lymphs Abs 1.0 0.7 - 4.0 K/uL   Monocytes Relative 10 %   Monocytes Absolute 1.0 0.1 - 1.0 K/uL   Eosinophils Relative 1 %   Eosinophils Absolute 0.1 0.0 - 0.5 K/uL   Basophils Relative 0 %   Basophils Absolute 0.0 0.0 - 0.1 K/uL   Immature Granulocytes 0 %   Abs Immature Granulocytes 0.04 0.00 - 0.07 K/uL    Comment: Performed at [facility], [address], [place], [place] [address]  Comprehensive metabolic panel     Status: Abnormal   Collection Time: 06/11/21  7:11 AM  Result Value Ref Range   Sodium 138 135 - 145 mmol/L   Potassium 3.7 3.5 - 5.1 mmol/L   Chloride 104 98 - 111 mmol/L   CO2 26 22 - 32 mmol/L   Glucose, Bld 102 (H) 70 - 99 mg/dL    Comment: Glucose reference range applies only to samples taken after fasting for at least 8 hours.   BUN 5 (L) 6 - 20 mg/dL   Creatinine, Ser 3.15 0.61 - 1.24 mg/dL   Calcium 8.6 (L) 8.9 - 10.3 mg/dL   Total Protein 6.4 (L) 6.5 - 8.1 g/dL   Albumin 2.7 (L) 3.5 - 5.0 g/dL   AST 176 (H) 15 - 41 U/L   ALT 160 (H) 0 - 44 U/L   Alkaline Phosphatase 110 38 - 126 U/L   Total Bilirubin 3.1 (H) 0.3 - 1.2 mg/dL   GFR, Estimated >16 >07 mL/min    Comment: (NOTE) Calculated using the CKD-EPI Creatinine Equation (2021)    Anion gap 8 5 - 15    Comment: Performed at [facility], [address], [place], [place] [address]  APTT     Status: None   Collection Time: 06/11/21  7:11 AM  Result Value Ref Range   aPTT 29 24 - 36 seconds    Comment: Performed at [facility], [address] [place], [place] [address]  Protime-INR     Status: None    Collection Time: 06/11/21  7:11 AM  Result Value Ref Range   Prothrombin Time 14.3 11.4 - 15.2 seconds   INR 1.1 0.8 - 1.2    Comment: (NOTE) INR goal varies based on device and disease states. Performed at [facility], [address], [place], [place] [address]    No results found.    Assessment/Plan Symptomatic cholelithiasis ?Choledocholithiasis - in setting of Chron's disease - GI on consult for possible ERCP - WBC 13.5>9.7 - monitor. Continue zosyn - AST/ALT 271/160, T bili 3.1 (1.3) - continue to monitor - await MRCP - possible laparoscopic cholecystectomy +/- IOC this admission  FEN: NPO awaiting MRCP ID: [email or web address] 6/16>> VTE: SCDs,okay for chemical prophylaxis from our perspective  Eric Form, [facility] 06/11/2021, 12:17 PM Please see Amion for pager number during day hours 7:00am-4:30pm

## 2021-06-11 NOTE — Progress Notes (Signed)
Pharmacy Antibiotic Note  Jesse Hanson is a 35 y.o. male admitted on 06/10/2021 with  intra-abdominal infection .  Pharmacy has been consulted for Zosyn dosing. WBC is elevated. Renal function good.   Plan: Zosyn 3.375G IV q8h to be infused over 4 hours Trend WBC, temp, renal function  F/U infectious work-up   Temp (24hrs), Avg:98.5 F (36.9 C), Min:98.5 F (36.9 C), Max:98.5 F (36.9 C)  Recent Labs  Lab 06/06/21 2228 06/10/21 2252  WBC 13.9* 13.5*  CREATININE 1.03 0.82    Estimated Creatinine Clearance: 154.2 mL/min (by C-G formula based on SCr of 0.82 mg/dL).    Not on File  Abran Duke, PharmD, BCPS Clinical Pharmacist Phone: [phone]

## 2021-06-11 NOTE — H&P (Signed)
History and Physical    Pleas Carneal MVE:[id number] DOB: Mar 06, 1986 DOA: 06/10/2021  PCP: Gaspar Garbe, MD  [facility]   Chief Complaint:  Chief Complaint  Patient presents with   Flank Pain   Nausea     HPI:    35 year old male with past medical history of Crohn's disease who presents to [facility]  as a transfer from [facility] due to ongoing abdominal pain and concerns for hepatobiliary disease.  Patient explains that on Sunday evening patient was at home when he suddenly began to experience abdominal pain.  Patient describes his abdominal pain as epigastric in location, severe in intensity, dull in quality, nonradiating and associated with intense nausea and several episodes of nonbilious nonbloody vomiting.  This is also associated with an inability to tolerate oral intake.  Patient's symptoms were so severe that he presented to [facility] ED for evaluation.  After a thorough evaluation which included CT imaging of the abdomen and pelvis, patient was discharged home and instructed to follow-up as an outpatient with his gastroenterologist.  Patient called his gastroenterologist on 6/13 and was scheduled to have an appointment with them on 6/14.  During that visit, patient was then proceeded to send the patient for a right upper quadrant ultrasound which was then performed on 6/15 and is available on Care Everywhere.  On 6/16 patient had he had another episode of severe abdominal pain, similar to the previous one except this time it was also associated with right flank pain.  Pain was severe in intensity, dull in quality, associated with nausea and frequent bouts of vomiting as well as diaphoresis.  Patient once again presented to [facility] ED for evaluation.  Due to ongoing symptoms of severe pain requiring opiate-based analgesics, as well as turn for both choledocholithiasis and cholecystitis  based on her provided ultrasound the hospitalist group was then called and patient was accepted for transfer to [facility] for continued medical care.    Review of Systems:   Review of Systems  Gastrointestinal:  Positive for abdominal pain.  All other systems reviewed and are negative.  Past Medical History:  Diagnosis Date   Crohn disease (HCC)    Other rosacea 05/01/2017    Past Surgical History:  Procedure Laterality Date   HERNIA REPAIR Left      reports that he has never smoked. He has never used smokeless tobacco. He reports current alcohol use of about 70.0 standard drinks of alcohol per week. He reports that he does not use drugs.  Not on File  Family History  Problem Relation Age of Onset   Heart disease Neg Hx      Prior to Admission medications   Not on File    Physical Exam: Vitals:   06/10/21 2249 06/11/21 0006 06/11/21 0310 06/11/21 0615  BP: (!) 152/105 130/89 130/88 (!) 134/91  Pulse: 91 95 79 67  Resp: (!) 23 20 20    Temp: 98.5 F (36.9 C)  98.6 F (37 C) 98.3 F (36.8 C)  TempSrc: Oral  Oral Oral  SpO2: 100% 99% 100% 99%    Constitutional: Awake alert and oriented x3, no associated distress.   Skin: no rashes, no lesions, good skin turgor noted. Eyes: Pupils are equally reactive to light.  No evidence of scleral icterus or conjunctival pallor.  ENMT: Moist mucous membranes noted.  Posterior pharynx clear of any exudate or lesions.   Neck: normal,  supple, no masses, no thyromegaly.  No evidence of jugular venous distension.   Respiratory: clear to auscultation bilaterally, no wheezing, no crackles. Normal respiratory effort. No accessory muscle use.  Cardiovascular: Regular rate and rhythm, no murmurs / rubs / gallops. No extremity edema. 2+ pedal pulses. No carotid bruits.  Chest:   Nontender without crepitus or deformity.   Back:   Nontender without crepitus or deformity. Abdomen: Epigastric and right upper quadrant tenderness.  Abdomen is  soft however.  No evidence of intra-abdominal masses.  Positive bowel sounds noted in all quadrants.   Musculoskeletal: No joint deformity upper and lower extremities. Good ROM, no contractures. Normal muscle tone.  Neurologic: CN 2-12 grossly intact. Sensation intact.  Patient moving all 4 extremities spontaneously.  Patient is following all commands.  Patient is responsive to verbal stimuli.   Psychiatric: Patient exhibits normal mood with appropriate affect.  Patient seems to possess insight as to their current situation.     Labs on Admission: I have personally reviewed following labs and imaging studies -   CBC: Recent Labs  Lab 06/06/21 2228 06/10/21 2252  WBC 13.9* 13.5*  HGB 15.2 14.5  HCT 43.9 41.5  MCV 93.4 93.3  PLT 428* 396   Basic Metabolic Panel: Recent Labs  Lab 06/06/21 2228 06/10/21 2252  NA 139 138  K 3.8 3.6  CL 101 100  CO2 26 28  GLUCOSE 143* 98  BUN 9 10  CREATININE 1.03 0.82  CALCIUM 9.8 9.9   GFR: Estimated Creatinine Clearance: 154.2 mL/min (by C-G formula based on SCr of 0.82 mg/dL). Liver Function Tests: Recent Labs  Lab 06/06/21 2228 06/10/21 2252  AST 49* 39  ALT 71* 29  ALKPHOS 60 81  BILITOT 0.9 1.3*  PROT 8.1 8.2*  ALBUMIN 4.2 4.2   Recent Labs  Lab 06/06/21 2228  LIPASE 11   No results for input(s): AMMONIA in the last 168 hours. Coagulation Profile: No results for input(s): INR, PROTIME in the last 168 hours. Cardiac Enzymes: No results for input(s): CKTOTAL, CKMB, CKMBINDEX, TROPONINI in the last 168 hours. BNP (last 3 results) No results for input(s): PROBNP in the last 8760 hours. HbA1C: No results for input(s): HGBA1C in the last 72 hours. CBG: No results for input(s): GLUCAP in the last 168 hours. Lipid Profile: No results for input(s): CHOL, HDL, LDLCALC, TRIG, CHOLHDL, LDLDIRECT in the last 72 hours. Thyroid Function Tests: No results for input(s): TSH, T4TOTAL, FREET4, T3FREE, THYROIDAB in the last 72  hours. Anemia Panel: No results for input(s): VITAMINB12, FOLATE, FERRITIN, TIBC, IRON, RETICCTPCT in the last 72 hours. Urine analysis:    Component Value Date/Time   COLORURINE YELLOW 06/11/2021 0205   APPEARANCEUR HAZY (A) 06/11/2021 0205   LABSPEC 1.024 06/11/2021 0205   PHURINE 6.5 06/11/2021 0205   GLUCOSEU NEGATIVE 06/11/2021 0205   HGBUR NEGATIVE 06/11/2021 0205   BILIRUBINUR NEGATIVE 06/11/2021 0205   KETONESUR NEGATIVE 06/11/2021 0205   PROTEINUR TRACE (A) 06/11/2021 0205   NITRITE NEGATIVE 06/11/2021 0205   LEUKOCYTESUR NEGATIVE 06/11/2021 0205    Radiological Exams on Admission - Personally Reviewed: No results found.  EKG: Personally reviewed.  Rhythm is normal sinus rhythm with heart rate of 72 bpm.  No dynamic ST segment changes appreciated.  Assessment/Plan Principal Problem:   Right upper quadrant pain  Patient presenting with substantial right upper quadrant and epigastric abdominal pain Right upper quadrant ultrasound performed on 6/15 suggestive of multiple possible etiologies for patient's presentation including the possibility of  cholecystitis and choledocholithiasis or even gallstone pancreatitis. Considering that the patient is experiencing a leukocytosis and the fact the patient is immunocompromised we will empirically treat with intravenous antibiotic therapy until cholecystitis has been completely ruled out. Ordering MRCP Additionally ordering HIDA scan [facility] has been contacted via secure chat and is planning on evaluating the patient on 6/17 in consultation.  Their input is appreciated. On intravenous fluids As needed antiemetics and opiate-based analgesics for substantial associated symptoms  Active Problems:   Crohn's disease of both small and large intestine without complications (HCC)  Patient is a longstanding history of reasonably well controlled Crohn's disease that the patient is suffering from since the age of 47. Due to  ongoing immunomodulating therapy with Stelara and azathioprine patient is currently immunocompromised hence the low threshold for initiating antibiotics as mentioned above   Code Status:  Full code Family Communication: Deferred  Status is: Observation  The patient remains OBS appropriate and will d/c before 2 midnights.  Dispo: The patient is from: Home              Anticipated d/c is to: Home              Patient currently is not medically stable to d/c.   Difficult to place patient No        Marinda Elk MD [facility] Pager [phone]  If 7PM-7AM, please contact night-coverage [email or web address] Use universal [place] password for that web site. If you do not have the password, please call the hospital operator.  06/11/2021, 7:30 AM

## 2021-06-11 NOTE — ED Notes (Signed)
Called [facility] to send patient to [facility] room# 18

## 2021-06-11 NOTE — Progress Notes (Signed)
Jesse Hanson is a 35 y.o. male patient admitted. Awake, alert - oriented  X 4 - no acute distress noted. Pt rated pain 1/10. VSS - Blood pressure 130/88, pulse 79, temperature 98.6 F (37 C), temperature source Oral, resp. rate 20, SpO2 100 %.    IV in place, occlusive dsg intact without redness.  Orientation to room, and floor completed.  Admission INP armband ID verified and in place. SR up x 2, fall assessment complete, with patient able to verbalize understanding of risk associated with falls, and verbalized understanding to call nsg before up out of bed.  Call light within reach, patient able to voice, and demonstrate understanding. No evidence of skin break down noted on exam.   Will cont to eval and treat per MD orders.

## 2021-06-11 NOTE — Consult Note (Signed)
[facility] Consult: 9:12 AM 06/11/2021  LOS: 0 days    Referring Provider: Dr Roda Shutters  Primary Care Physician:  Wylene Simmer Adelfa Koh, MD Primary Gastroenterologist:  Dr. Sharrell Ku    Reason for Consultation:  cholelithiasis, borderline dilated CBD, rising LFTs/? choledochohiasis   HPI: Jesse Hanson is a 35 y.o. male.  PMH Crohn's disease of small bowel and colon.  Diagnosed age 35.  Underwent surgery to address perirectal fistula age 25 or 58 but no intestinal resections.  On azathioprine and Stelara every 2 months.  Crohn's well controlled with formed, nonbloody stools, good appetite, stable weight.  Iron deficiency anemia, on oral iron.  Umbilical hernia repair 02/2021.  Patient is a heavy drinker and reports drinking up to 9 beers every other day.  On days when he does not drink beer, he drinks rum and can go through half a gallon of rum over the course of 5 days.  However he does not drink either rum or beer every day.  There are days when he does not drink.  Recently has tried to cut back because he was knows it is not good for him.  Last colonoscopy 05/2019 at [facility] in [place]. Normal rectum, sigmoid and descending colon. Transverse colon erythema, superficial serpiginous ulcers.  Remainder of colon and TI normal.  Pathology: Mild, focal acute inflammation without chronicity.  Differential of nonspecific colitis, medication associated colitis, resolving colitis and less likely early stages idiopathic IBD.  Seen at [facility] ED 6/12 with abdominal pain, nausea, nonbloody vomiting.  CTAP w contrast revealed no evidence for acute Crohn's disease, obstruction or active process.  Had diffuse fatty liver and small, fat-containing left inguinal hernia.  Lab abnormalities included AST/ALT of  49/71 but otherwise normal LFTs.  Lipase 11. glucose 143.  Treated with Zofran, Dilaudid, fentanyl.  Felt better and discharged home.  Describes the pain as starting in the xiphoid region/right upper quadrant.  May or may not radiate to the back/scapula. Felt pretty well on Monday.  Seen by GI PA Tuesday.  Ultrasound Wed: RUQ tenderness on exam.  Ultrasound on Wednesday revealed cholelithiasis, mild GB wall thickening.  7 mm CBD.  No intrahepatic ductal dilatation.  Incomplete visualization pancreas.  Hepatomegaly and increased liver echo suggestive of fatty liver.  Liver contour smooth. Repeat labs of 6/14 with normal lipase, T bili 1.5 but otherwise normal LFTs.  WBCs 13.6. GI advised patient to proceed to ED if symptoms worsen. Due to ongoing and progressive right upper quadrant pain, return to [facility] yesterday evening.  Has not had repeated emesis since Sunday.  Between yesterday's arrival and today's repeat labs.  Transaminases and bilirubin have significantly elevated.  T bili 1.3 >> 3.1.  AST/ALT 39/29 >> 271/160.  Alkaline phosphatase normal at 110.  Lipase has not been repeated. WBCs 13.5 >> 9.7.  Hb 12.9.  COVID-19 negative. MRI abdomen/MRCP ordered but not completed.  Works in the [employer] business.  Lives alone.  Etoh intake as above. Both of his parents have diagnosis of Crohn's disease.  His father required surgery for this.    Past Medical History:  Diagnosis Date   Crohn disease (HCC)    Other rosacea 05/01/2017    Past Surgical History:  Procedure Laterality Date   HERNIA REPAIR Left     Prior to Admission medications   Not on File    Scheduled Meds:  Infusions:  lactated ringers 125 mL/hr at 06/11/21 0727   piperacillin-tazobactam (ZOSYN)  IV 3.375 g (06/11/21 0646)   PRN Meds: acetaminophen **OR** acetaminophen, HYDROmorphone (DILAUDID) injection **OR** HYDROmorphone (DILAUDID) injection, ondansetron (ZOFRAN) IV   Allergies as of 06/10/2021    (Not on File)    Family History  Problem Relation Age of Onset   Heart disease Neg Hx     Social History   Socioeconomic History   Marital status: Single    Spouse name: Not on file   Number of children: Not on file   Years of education: Not on file   Highest education level: Not on file  Occupational History   Not on file  Tobacco Use   Smoking status: Never   Smokeless tobacco: Never  Vaping Use   Vaping Use: Never used  Substance and Sexual Activity   Alcohol use: Yes    Alcohol/week: 70.0 standard drinks    Types: 70 Standard drinks or equivalent per week   Drug use: Never   Sexual activity: Not Currently  Other Topics Concern   Not on file  Social History Narrative   Not on file   Social Determinants of Health   Financial Resource Strain: Not on file  Food Insecurity: Not on file  Transportation Needs: Not on file  Physical Activity: Not on file  Stress: Not on file  Social Connections: Not on file  Intimate Partner Violence: Not on file    REVIEW OF SYSTEMS: Constitutional: Feeling a bit under the weather but no profound fatigue or weakness ENT:  No nose bleeds Pulm: No shortness of breath.  When he takes a deep breath it exacerbates the right abdominal pain. CV:  No palpitations, no LE edema.  GU:  No hematuria, no frequency GI: See HPI. Heme: Denies unusual or excessive bleeding or bruising. Transfusions: None. Neuro:  No headaches, no peripheral tingling or numbness Derm:  No itching, no rash or sores.  Endocrine:  No sweats or chills.  No polyuria or dysuria Immunization: Vaccinated and boosted for COVID-19. Travel:  None beyond local counties in last few months.    PHYSICAL EXAM: Vital signs in last 24 hours: Vitals:   06/11/21 0310 06/11/21 0615  BP: 130/88 (!) 134/91  Pulse: 79 67  Resp: 20   Temp: 98.6 F (37 C) 98.3 F (36.8 C)  SpO2: 100% 99%   Wt Readings from Last 3 Encounters:  06/06/21 99.8 kg    General: Comfortable,  does not appear acutely ill. Head: No facial asymmetry or swelling.  No signs of head trauma. Eyes: No scleral icterus.  No conjunctival pallor. Ears: Not hard of hearing Nose: No congestion or discharge Mouth: Good dentition.  Tongue midline.  Mucosa pink, moist, clear. Neck: No JVD, no masses, no thyromegaly Lungs: Clear bilaterally.  Patient does say deep breathing is triggering increased right upper quadrant discomfort Heart: RRR. Abdomen: Protuberant but soft.  Tenderness begins in the epigastrium and present in right upper quadrant.  No guarding or rebound.  Bowel sounds active..   Rectal: Deferred Musc/Skeltl: No joint redness, swelling or gross deformity. Extremities: No CCE. Neurologic: Oriented x3.  Fully alert.  Great historian.  Moves all 4 limbs without weakness or tremor.  Ambulates without assistance. Skin: Facial acne.  No telangiectasia, no significant purpura or bruising. Nodes: No cervical adenopathy Psych: Cooperative, pleasant, calm.  In good spirits  Intake/Output from previous day: No intake/output data recorded. Intake/Output this shift: No intake/output data recorded.  LAB RESULTS: Recent Labs    06/10/21 2252 06/11/21 0711  WBC 13.5* 9.7  HGB 14.5 12.9*  HCT 41.5 37.7*  PLT 396 322   BMET Lab Results  Component Value Date   NA 138 06/11/2021   NA 138 06/10/2021   NA 139 06/06/2021   K 3.7 06/11/2021   K 3.6 06/10/2021   K 3.8 06/06/2021   CL 104 06/11/2021   CL 100 06/10/2021   CL 101 06/06/2021   CO2 26 06/11/2021   CO2 28 06/10/2021   CO2 26 06/06/2021   GLUCOSE 102 (H) 06/11/2021   GLUCOSE 98 06/10/2021   GLUCOSE 143 (H) 06/06/2021   BUN 5 (L) 06/11/2021   BUN 10 06/10/2021   BUN 9 06/06/2021   CREATININE 0.80 06/11/2021   CREATININE 0.82 06/10/2021   CREATININE 1.03 06/06/2021   CALCIUM 8.6 (L) 06/11/2021   CALCIUM 9.9 06/10/2021   CALCIUM 9.8 06/06/2021   LFT Recent Labs    06/10/21 2252 06/11/21 0711  PROT 8.2* 6.4*   ALBUMIN 4.2 2.7*  AST 39 271*  ALT 29 160*  ALKPHOS 81 110  BILITOT 1.3* 3.1*  BILIDIR 0.5*  --   IBILI 0.8  --    PT/INR Lab Results  Component Value Date   INR 1.1 06/11/2021   Hepatitis Panel No results for input(s): HEPBSAG, HCVAB, HEPAIGM, HEPBIGM in the last 72 hours. C-Diff No components found for: CDIFF Lipase     Component Value Date/Time   LIPASE 11 06/06/2021 2228    Drugs of Abuse  No results found for: LABOPIA, COCAINSCRNUR, LABBENZ, AMPHETMU, THCU, LABBARB   RADIOLOGY STUDIES: No results found.    IMPRESSION:       Symptomatic cholelithiasis.      R/O choledocholithiasis.  Mild CBD dilatation on ultrasound and this morning has acutely elevated transaminases, T bili.   MRCP pending.     History Crohn's disease, notes mention small and large bowel disease.  Symptoms well controlled on daily azathioprine, every 2 month Stelara.  Last colonoscopy 2020, some disease activity and transverse colon.  No evidence for active Crohn's on recent CT scan.     Alcohol abuse.  See HPI details.  Fatty liver noted on imaging.    Umbilical hernia repair 02/2021    PLAN:         Await MRCP and decide if he needs ERCP (Endoscopicretrogradecholangiopancreatography) or if he can go straight to laparoscopic cholecystectomy.  I ran into the surgical APP and passed on the consult so surgery should see patient today.   A HIDA scan is also ordered.  Empiric Zosyn in place.      Continue daily Azathioprine. 100 mg daily, ordered.     Jennye Moccasin  06/11/2021, 9:12 AM Phone [phone]

## 2021-06-11 NOTE — Plan of Care (Signed)

## 2021-06-11 NOTE — Progress Notes (Signed)
Patient AAOx4. Does complain of pain but made aware that nurse was told not to administer Narcotics for diagnotic test, last does was given at 0612 and he needs to be without narcotic ain meds for 6 hours for scan to be done. MRI called and advised to keep patient strictly NPO, made team aware he has been NPO since last night, therefore, will not administer oral meds until all diagnostic testing is done.

## 2021-06-11 NOTE — Progress Notes (Signed)
Patient is admitted early this am , he continue to have epigastric and right upper quadrant pain requiring prn analgesics, no active vomiting, no fever, he is npo awaiting for mrcp and HIDA scan, he is on Iv abx, wbc normalized, lft worsened, lipase 11. GI consulted.

## 2021-06-12 ENCOUNTER — Observation Stay (HOSPITAL_COMMUNITY): Payer: Managed Care, Other (non HMO) | Admitting: Anesthesiology

## 2021-06-12 ENCOUNTER — Observation Stay (HOSPITAL_COMMUNITY): Payer: Managed Care, Other (non HMO)

## 2021-06-12 ENCOUNTER — Encounter (HOSPITAL_COMMUNITY)
Admission: EM | Disposition: A | Discharge status: Home / Self Care | Payer: Self-pay | Source: Home / Self Care | Attending: Family Medicine

## 2021-06-12 ENCOUNTER — Encounter (HOSPITAL_COMMUNITY): Payer: Self-pay | Admitting: Family Medicine

## 2021-06-12 DIAGNOSIS — E669 Obesity, unspecified: Secondary | ICD-10-CM | POA: Diagnosis present

## 2021-06-12 DIAGNOSIS — K759 Inflammatory liver disease, unspecified: Secondary | ICD-10-CM | POA: Diagnosis present

## 2021-06-12 DIAGNOSIS — K2970 Gastritis, unspecified, without bleeding: Secondary | ICD-10-CM | POA: Diagnosis present

## 2021-06-12 DIAGNOSIS — K82A1 Gangrene of gallbladder in cholecystitis: Secondary | ICD-10-CM | POA: Diagnosis present

## 2021-06-12 DIAGNOSIS — D509 Iron deficiency anemia, unspecified: Secondary | ICD-10-CM | POA: Diagnosis present

## 2021-06-12 DIAGNOSIS — F1010 Alcohol abuse, uncomplicated: Secondary | ICD-10-CM | POA: Diagnosis present

## 2021-06-12 DIAGNOSIS — K760 Fatty (change of) liver, not elsewhere classified: Secondary | ICD-10-CM | POA: Diagnosis present

## 2021-06-12 DIAGNOSIS — D849 Immunodeficiency, unspecified: Secondary | ICD-10-CM | POA: Diagnosis present

## 2021-06-12 DIAGNOSIS — Z20822 Contact with and (suspected) exposure to covid-19: Secondary | ICD-10-CM | POA: Diagnosis present

## 2021-06-12 DIAGNOSIS — I10 Essential (primary) hypertension: Secondary | ICD-10-CM | POA: Diagnosis present

## 2021-06-12 DIAGNOSIS — R17 Unspecified jaundice: Secondary | ICD-10-CM | POA: Diagnosis not present

## 2021-06-12 DIAGNOSIS — K8063 Calculus of gallbladder and bile duct with acute cholecystitis with obstruction: Secondary | ICD-10-CM | POA: Diagnosis present

## 2021-06-12 DIAGNOSIS — R1011 Right upper quadrant pain: Secondary | ICD-10-CM | POA: Diagnosis present

## 2021-06-12 DIAGNOSIS — K5080 Crohn's disease of both small and large intestine without complications: Secondary | ICD-10-CM | POA: Diagnosis present

## 2021-06-12 HISTORY — PX: CHOLECYSTECTOMY: SHX55

## 2021-06-12 LAB — CBC
HCT: 35.8 % — ABNORMAL LOW (ref 39.0–52.0)
Hemoglobin: 12.1 g/dL — ABNORMAL LOW (ref 13.0–17.0)
MCH: 32.8 pg (ref 26.0–34.0)
MCHC: 33.8 g/dL (ref 30.0–36.0)
MCV: 97 fL (ref 80.0–100.0)
Platelets: 350 10*3/uL (ref 150–400)
RBC: 3.69 MIL/uL — ABNORMAL LOW (ref 4.22–5.81)
RDW: 13 % (ref 11.5–15.5)
WBC: 8.1 10*3/uL (ref 4.0–10.5)
nRBC: 0 % (ref 0.0–0.2)

## 2021-06-12 LAB — HEPATIC FUNCTION PANEL
ALT: 283 U/L — ABNORMAL HIGH (ref 0–44)
AST: 239 U/L — ABNORMAL HIGH (ref 15–41)
Albumin: 2.5 g/dL — ABNORMAL LOW (ref 3.5–5.0)
Alkaline Phosphatase: 99 U/L (ref 38–126)
Bilirubin, Direct: 2.3 mg/dL — ABNORMAL HIGH (ref 0.0–0.2)
Indirect Bilirubin: 1.3 mg/dL — ABNORMAL HIGH (ref 0.3–0.9)
Total Bilirubin: 3.6 mg/dL — ABNORMAL HIGH (ref 0.3–1.2)
Total Protein: 6.2 g/dL — ABNORMAL LOW (ref 6.5–8.1)

## 2021-06-12 LAB — BASIC METABOLIC PANEL
Anion gap: 7 (ref 5–15)
BUN: 6 mg/dL (ref 6–20)
CO2: 26 mmol/L (ref 22–32)
Calcium: 8.5 mg/dL — ABNORMAL LOW (ref 8.9–10.3)
Chloride: 106 mmol/L (ref 98–111)
Creatinine, Ser: 0.9 mg/dL (ref 0.61–1.24)
GFR, Estimated: >60 mL/min (ref >60)
Glucose, Bld: 82 mg/dL (ref 70–99)
Potassium: 3.6 mmol/L (ref 3.5–5.1)
Sodium: 139 mmol/L (ref 135–145)

## 2021-06-12 LAB — GLUCOSE, CAPILLARY: Glucose-Capillary: 110 mg/dL — ABNORMAL HIGH (ref 70–99)

## 2021-06-12 SURGERY — LAPAROSCOPIC CHOLECYSTECTOMY WITH INTRAOPERATIVE CHOLANGIOGRAM
Anesthesia: General | Site: Abdomen

## 2021-06-12 MED ORDER — METHOCARBAMOL 500 MG PO TABS
500.0000 mg | ORAL_TABLET | Freq: Four times a day (QID) | ORAL | Status: DC | PRN
  Administered 2021-06-12: 500 mg via ORAL
  Filled 2021-06-12: qty 1

## 2021-06-12 MED ORDER — GLUCAGON HCL RDNA (DIAGNOSTIC) 1 MG IJ SOLR
INTRAMUSCULAR | Status: AC
  Filled 2021-06-12: qty 1

## 2021-06-12 MED ORDER — ONDANSETRON HCL 4 MG/2ML IJ SOLN
INTRAMUSCULAR | Status: DC | PRN
  Administered 2021-06-12: 4 mg via INTRAVENOUS

## 2021-06-12 MED ORDER — FENTANYL CITRATE (PF) 250 MCG/5ML IJ SOLN
INTRAMUSCULAR | Status: DC | PRN
  Administered 2021-06-12: 100 ug via INTRAVENOUS
  Administered 2021-06-12 (×2): 50 ug via INTRAVENOUS

## 2021-06-12 MED ORDER — LACTATED RINGERS IV SOLN
INTRAVENOUS | Status: DC
  Administered 2021-06-13: 999 mL via INTRAVENOUS

## 2021-06-12 MED ORDER — SUGAMMADEX SODIUM 200 MG/2ML IV SOLN
INTRAVENOUS | Status: DC | PRN
  Administered 2021-06-12: 400 mg via INTRAVENOUS

## 2021-06-12 MED ORDER — DEXAMETHASONE SODIUM PHOSPHATE 10 MG/ML IJ SOLN
INTRAMUSCULAR | Status: AC
  Filled 2021-06-12: qty 1

## 2021-06-12 MED ORDER — LABETALOL HCL 5 MG/ML IV SOLN
INTRAVENOUS | Status: DC | PRN
  Administered 2021-06-12: 5 mg via INTRAVENOUS

## 2021-06-12 MED ORDER — GLUCAGON HCL RDNA (DIAGNOSTIC) 1 MG IJ SOLR
INTRAMUSCULAR | Status: DC | PRN
  Administered 2021-06-12: 1 mg via INTRAVENOUS

## 2021-06-12 MED ORDER — ACETAMINOPHEN 500 MG PO TABS: 1000.0000 mg | ORAL_TABLET | Freq: Four times a day (QID) | ORAL | Status: DC | PRN

## 2021-06-12 MED ORDER — FENTANYL CITRATE (PF) 250 MCG/5ML IJ SOLN
INTRAMUSCULAR | Status: AC
  Filled 2021-06-12: qty 5

## 2021-06-12 MED ORDER — BUPIVACAINE HCL 0.25 % IJ SOLN
INTRAMUSCULAR | Status: DC | PRN
  Administered 2021-06-12: 20 mL

## 2021-06-12 MED ORDER — DEXMEDETOMIDINE (PRECEDEX) IN NS 20 MCG/5ML (4 MCG/ML) IV SYRINGE
PREFILLED_SYRINGE | INTRAVENOUS | Status: DC | PRN
  Administered 2021-06-12: 12 ug via INTRAVENOUS
  Administered 2021-06-12: 8 ug via INTRAVENOUS

## 2021-06-12 MED ORDER — DEXAMETHASONE SODIUM PHOSPHATE 10 MG/ML IJ SOLN
INTRAMUSCULAR | Status: DC | PRN
  Administered 2021-06-12: 5 mg via INTRAVENOUS

## 2021-06-12 MED ORDER — MORPHINE SULFATE (PF) 2 MG/ML IV SOLN
2.0000 mg | INTRAVENOUS | Status: DC | PRN
  Administered 2021-06-12 – 2021-06-13 (×3): 2 mg via INTRAVENOUS
  Filled 2021-06-12 (×3): qty 1

## 2021-06-12 MED ORDER — ORAL CARE MOUTH RINSE: 15.0000 mL | Freq: Once | OROMUCOSAL | Status: AC

## 2021-06-12 MED ORDER — MIDAZOLAM HCL 2 MG/2ML IJ SOLN
INTRAMUSCULAR | Status: DC | PRN
  Administered 2021-06-12: 2 mg via INTRAVENOUS

## 2021-06-12 MED ORDER — ROCURONIUM BROMIDE 10 MG/ML (PF) SYRINGE
PREFILLED_SYRINGE | INTRAVENOUS | Status: DC | PRN
  Administered 2021-06-12: 50 mg via INTRAVENOUS
  Administered 2021-06-12: 20 mg via INTRAVENOUS

## 2021-06-12 MED ORDER — LACTATED RINGERS IV SOLN: INTRAVENOUS | Status: DC

## 2021-06-12 MED ORDER — CHLORHEXIDINE GLUCONATE 0.12 % MT SOLN
OROMUCOSAL | Status: AC
  Filled 2021-06-12: qty 15

## 2021-06-12 MED ORDER — BUPIVACAINE HCL (PF) 0.25 % IJ SOLN
INTRAMUSCULAR | Status: AC
  Filled 2021-06-12: qty 30

## 2021-06-12 MED ORDER — PROPOFOL 10 MG/ML IV BOLUS
INTRAVENOUS | Status: AC
  Filled 2021-06-12: qty 40

## 2021-06-12 MED ORDER — MIDAZOLAM HCL 2 MG/2ML IJ SOLN
INTRAMUSCULAR | Status: AC
  Filled 2021-06-12: qty 2

## 2021-06-12 MED ORDER — ACETAMINOPHEN 10 MG/ML IV SOLN
INTRAVENOUS | Status: DC | PRN
  Administered 2021-06-12: 1000 mg via INTRAVENOUS

## 2021-06-12 MED ORDER — LIDOCAINE HCL (PF) 2 % IJ SOLN
INTRAMUSCULAR | Status: AC
  Filled 2021-06-12: qty 5

## 2021-06-12 MED ORDER — LIDOCAINE 2% (20 MG/ML) 5 ML SYRINGE
INTRAMUSCULAR | Status: DC | PRN
  Administered 2021-06-12: 60 mg via INTRAVENOUS
  Administered 2021-06-12: 40 mg via INTRAVENOUS

## 2021-06-12 MED ORDER — CHLORHEXIDINE GLUCONATE 0.12 % MT SOLN
15.0000 mL | Freq: Once | OROMUCOSAL | Status: AC
  Administered 2021-06-12: 15 mL via OROMUCOSAL

## 2021-06-12 MED ORDER — ONDANSETRON HCL 4 MG/2ML IJ SOLN
INTRAMUSCULAR | Status: AC
  Filled 2021-06-12: qty 2

## 2021-06-12 MED ORDER — OXYCODONE HCL 5 MG PO TABS
5.0000 mg | ORAL_TABLET | ORAL | Status: DC | PRN
  Administered 2021-06-12 – 2021-06-13 (×3): 10 mg via ORAL
  Administered 2021-06-13: 5 mg via ORAL
  Administered 2021-06-14: 10 mg via ORAL
  Filled 2021-06-12: qty 2
  Filled 2021-06-12: qty 1
  Filled 2021-06-12 (×4): qty 2

## 2021-06-12 MED ORDER — SODIUM CHLORIDE 0.9 % IR SOLN
Status: DC | PRN
  Administered 2021-06-12: 1000 mL

## 2021-06-12 MED ORDER — ROCURONIUM BROMIDE 10 MG/ML (PF) SYRINGE
PREFILLED_SYRINGE | INTRAVENOUS | Status: AC
  Filled 2021-06-12: qty 10

## 2021-06-12 MED ORDER — SODIUM CHLORIDE 0.9 % IV SOLN
INTRAVENOUS | Status: DC | PRN
  Administered 2021-06-12: 20 mL

## 2021-06-12 MED ORDER — SUCCINYLCHOLINE CHLORIDE 200 MG/10ML IV SOSY
PREFILLED_SYRINGE | INTRAVENOUS | Status: DC | PRN
  Administered 2021-06-12: 140 mg via INTRAVENOUS

## 2021-06-12 MED ORDER — PROPOFOL 10 MG/ML IV BOLUS
INTRAVENOUS | Status: DC | PRN
  Administered 2021-06-12: 20 mg via INTRAVENOUS
  Administered 2021-06-12: 50 mg via INTRAVENOUS
  Administered 2021-06-12: 180 mg via INTRAVENOUS

## 2021-06-12 MED ORDER — 0.9 % SODIUM CHLORIDE (POUR BTL) OPTIME
TOPICAL | Status: DC | PRN
  Administered 2021-06-12: 1000 mL

## 2021-06-12 SURGICAL SUPPLY — 49 items
ADH SKN CLS APL DERMABOND .7 (GAUZE/BANDAGES/DRESSINGS) ×1
APL PRP STRL LF DISP 70% ISPRP (MISCELLANEOUS) ×1
APPLIER CLIP ROT 10 11.4 M/L (STAPLE) ×3
APR CLP MED LRG 11.4X10 (STAPLE) ×1
BAG SPEC RTRVL 10 TROC 200 (ENDOMECHANICALS) ×1
BIOPATCH RED 1 DISK 7.0 (GAUZE/BANDAGES/DRESSINGS) ×1 IMPLANT
BIOPATCH RED 1IN DISK 7.0MM (GAUZE/BANDAGES/DRESSINGS) ×1
BLADE CLIPPER SURG (BLADE) ×2 IMPLANT
CANISTER SUCT 3000ML PPV (MISCELLANEOUS) ×3 IMPLANT
CATH CHOLANG 76X19 KUMAR (CATHETERS) ×3 IMPLANT
CHLORAPREP W/TINT 26 (MISCELLANEOUS) ×3 IMPLANT
CLIP APPLIE ROT 10 11.4 M/L (STAPLE) IMPLANT
CLIP VESOLOCK MED LG 6/CT (CLIP) IMPLANT
COVER MAYO STAND STRL (DRAPES) ×3 IMPLANT
COVER SURGICAL LIGHT HANDLE (MISCELLANEOUS) ×3 IMPLANT
DERMABOND ADVANCED (GAUZE/BANDAGES/DRESSINGS) ×2
DERMABOND ADVANCED .7 DNX12 (GAUZE/BANDAGES/DRESSINGS) ×1 IMPLANT
DRAIN CHANNEL 19F RND (DRAIN) ×2 IMPLANT
DRAPE C-ARM 42X120 X-RAY (DRAPES) ×3 IMPLANT
DRSG TEGADERM 4X4.75 (GAUZE/BANDAGES/DRESSINGS) ×2 IMPLANT
ELECT REM PT RETURN 9FT ADLT (ELECTROSURGICAL) ×3
ELECTRODE REM PT RTRN 9FT ADLT (ELECTROSURGICAL) ×1 IMPLANT
EVACUATOR SILICONE 100CC (DRAIN) ×2 IMPLANT
GLOVE SURG POLYISO LF SZ7 (GLOVE) ×3 IMPLANT
GLOVE SURG UNDER POLY LF SZ7 (GLOVE) ×3 IMPLANT
GOWN STRL REUS W/ TWL LRG LVL3 (GOWN DISPOSABLE) ×3 IMPLANT
GOWN STRL REUS W/TWL LRG LVL3 (GOWN DISPOSABLE) ×9
GRASPER SUT TROCAR 14GX15 (MISCELLANEOUS) ×3 IMPLANT
IV CATH 14GX2 1/4 (CATHETERS) ×2 IMPLANT
KIT BASIN OR (CUSTOM PROCEDURE TRAY) ×3 IMPLANT
KIT TURNOVER KIT B (KITS) ×3 IMPLANT
NEEDLE 22X1 1/2 (OR ONLY) (NEEDLE) ×3 IMPLANT
NS IRRIG 1000ML POUR BTL (IV SOLUTION) ×3 IMPLANT
PAD ARMBOARD 7.5X6 YLW CONV (MISCELLANEOUS) ×3 IMPLANT
POUCH RETRIEVAL ECOSAC 10 (ENDOMECHANICALS) ×1 IMPLANT
POUCH RETRIEVAL ECOSAC 10MM (ENDOMECHANICALS) ×3
SCISSORS LAP 5X35 DISP (ENDOMECHANICALS) ×3 IMPLANT
SET IRRIG TUBING LAPAROSCOPIC (IRRIGATION / IRRIGATOR) ×3 IMPLANT
SET TUBE SMOKE EVAC HIGH FLOW (TUBING) ×3 IMPLANT
SLEEVE ENDOPATH XCEL 5M (ENDOMECHANICALS) ×8 IMPLANT
SPECIMEN JAR SMALL (MISCELLANEOUS) ×3 IMPLANT
STOPCOCK 4 WAY LG BORE MALE ST (IV SETS) ×3 IMPLANT
SUT ETHILON 2 0 FS 18 (SUTURE) ×2 IMPLANT
SUT MNCRL AB 4-0 PS2 18 (SUTURE) ×3 IMPLANT
TOWEL GREEN STERILE (TOWEL DISPOSABLE) ×3 IMPLANT
TRAY LAPAROSCOPIC MC (CUSTOM PROCEDURE TRAY) ×3 IMPLANT
TROCAR XCEL 12X100 BLDLESS (ENDOMECHANICALS) ×1 IMPLANT
TROCAR XCEL NON-BLD 5MMX100MML (ENDOMECHANICALS) ×3 IMPLANT
WATER STERILE IRR 1000ML POUR (IV SOLUTION) ×3 IMPLANT

## 2021-06-12 NOTE — Anesthesia Preprocedure Evaluation (Signed)
Anesthesia Evaluation  Patient identified by MRN, date of birth, ID band Patient awake    Reviewed: Allergy & Precautions, NPO status , Patient's Chart, lab work & pertinent test results  Airway Mallampati: II  TM Distance: >3 FB Neck ROM: Full    Dental  (+) Teeth Intact, Dental Advisory Given   Pulmonary    breath sounds clear to auscultation       Cardiovascular  Rhythm:Regular Rate:Normal     Neuro/Psych    GI/Hepatic   Endo/Other    Renal/GU      Musculoskeletal   Abdominal   Peds  Hematology   Anesthesia Other Findings   Reproductive/Obstetrics                             Anesthesia Physical Anesthesia Plan  ASA: 2  Anesthesia Plan: General   Post-op Pain Management:    Induction: Intravenous  PONV Risk Score and Plan: Ondansetron and Dexamethasone  Airway Management Planned: Oral ETT  Additional Equipment:   Intra-op Plan:   Post-operative Plan: Extubation in OR  Informed Consent: I have reviewed the patients History and Physical, chart, labs and discussed the procedure including the risks, benefits and alternatives for the proposed anesthesia with the patient or authorized representative who has indicated his/her understanding and acceptance.     Dental advisory given  Plan Discussed with: CRNA and Anesthesiologist  Anesthesia Plan Comments:         Anesthesia Quick Evaluation  

## 2021-06-12 NOTE — Progress Notes (Signed)
Pre Procedure note for inpatients:   Jesse Hanson has been scheduled for Procedure(s): LAPAROSCOPIC CHOLECYSTECTOMY WITH INTRAOPERATIVE CHOLANGIOGRAM (N/A) today. The various methods of treatment have been discussed with the patient. After consideration of the risks, benefits and treatment options the patient has consented to the planned procedure.   The patient has been seen and labs reviewed. There are no changes in the patient's condition to prevent proceeding with the planned procedure today.  Recent labs:  Lab Results  Component Value Date   WBC 8.1 06/12/2021   HGB 12.1 (L) 06/12/2021   HCT 35.8 (L) 06/12/2021   PLT 350 06/12/2021   GLUCOSE 82 06/12/2021   ALT 283 (H) 06/12/2021   AST 239 (H) 06/12/2021   NA 139 06/12/2021   K 3.6 06/12/2021   CL 106 06/12/2021   CREATININE 0.90 06/12/2021   BUN 6 06/12/2021   CO2 26 06/12/2021   INR 1.1 06/11/2021    Rodman Pickle, MD 06/12/2021 8:43 AM

## 2021-06-12 NOTE — Anesthesia Procedure Notes (Signed)
Procedure Name: Intubation Date/Time: 06/12/2021 9:25 AM Performed by: Rande Brunt, CRNA Pre-anesthesia Checklist: Patient identified, Emergency Drugs available, Suction available and Patient being monitored Patient Re-evaluated:Patient Re-evaluated prior to induction Oxygen Delivery Method: Circle System Utilized Preoxygenation: Pre-oxygenation with 100% oxygen Induction Type: IV induction, Cricoid Pressure applied and Rapid sequence Ventilation: Mask ventilation without difficulty Laryngoscope Size: Mac and 4 Grade View: Grade I Tube type: Oral Tube size: 7.5 mm Number of attempts: 1 Airway Equipment and Method: Stylet and Oral airway Placement Confirmation: ETT inserted through vocal cords under direct vision, positive ETCO2 and breath sounds checked- equal and bilateral Secured at: 22 cm Tube secured with: Tape Dental Injury: Teeth and Oropharynx as per pre-operative assessment

## 2021-06-12 NOTE — Plan of Care (Signed)

## 2021-06-12 NOTE — Anesthesia Postprocedure Evaluation (Signed)
Anesthesia Post Note  Patient: Jesse Hanson  Procedure(s) Performed: LAPAROSCOPIC CHOLECYSTECTOMY WITH INTRAOPERATIVE CHOLANGIOGRAM (Abdomen)     Patient location during evaluation: PACU Anesthesia Type: General Level of consciousness: awake and alert Pain management: pain level controlled Vital Signs Assessment: post-procedure vital signs reviewed and stable Respiratory status: spontaneous breathing, nonlabored ventilation, respiratory function stable and patient connected to nasal cannula oxygen Cardiovascular status: blood pressure returned to baseline and stable Postop Assessment: no apparent nausea or vomiting Anesthetic complications: no   No notable events documented.  Last Vitals:  Vitals:   06/12/21 1209 06/12/21 1440  BP: (!) 138/99 (!) 155/90  Pulse: 84 88  Resp: 18 18  Temp: 36.9 C 36.6 C  SpO2: 97% 98%    Last Pain:  Vitals:   06/12/21 1440  TempSrc: Oral  PainSc:                  Maelani Yarbro COKER

## 2021-06-12 NOTE — Transfer of Care (Signed)
Immediate Anesthesia Transfer of Care Note  Patient: Jesse Hanson  Procedure(s) Performed: LAPAROSCOPIC CHOLECYSTECTOMY WITH INTRAOPERATIVE CHOLANGIOGRAM (Abdomen)  Patient Location: PACU  Anesthesia Type:General  Level of Consciousness: awake, alert , drowsy and patient cooperative  Airway & Oxygen Therapy: Patient Spontanous Breathing and Patient connected to nasal cannula oxygen  Post-op Assessment: Report given to RN, Post -op Vital signs reviewed and stable and Patient moving all extremities X 4  Post vital signs: Reviewed and stable  Last Vitals:  Vitals Value Taken Time  BP 114/81 06/12/21 1120  Temp    Pulse 83 06/12/21 1123  Resp 17 06/12/21 1123  SpO2 90 % 06/12/21 1123  Vitals shown include unvalidated device data.  Last Pain:  Vitals:   06/12/21 0849  TempSrc: Oral  PainSc: 1       Patients Stated Pain Goal: 0 (06/11/21 0612)  Complications: No notable events documented.

## 2021-06-12 NOTE — Op Note (Signed)
PATIENT:  Jesse Hanson  35 y.o. male  PRE-OPERATIVE DIAGNOSIS:  Acute Cholecystitis  POST-OPERATIVE DIAGNOSIS:  Acute Cholecystitis  PROCEDURE:  Procedure(s): LAPAROSCOPIC CHOLECYSTECTOMY WITH INTRAOPERATIVE CHOLANGIOGRAM   SURGEON:  Surgeon(s): Marjani Kobel, De Blanch, MD  ASSISTANT: none  ANESTHESIA:   local and general  Indications for procedure: Jesse Hanson is a 35 y.o. male with symptoms of Abdominal pain and Nausea and vomiting consistent with gallbladder disease, Confirmed by ultrasound, MRI, and nuclear medicine study.  Description of procedure: The patient was brought into the operative suite, placed supine. Anesthesia was administered with endotracheal tube. Patient was strapped in place and foot board was secured. All pressure points were offloaded by foam padding. The patient was prepped and draped in the usual sterile fashion.  A left subcostal incision was made and optical entry was used to enter the abdomen. 1 5 mm trocar was placed in the right mid abdomen. 2 5 mm trocars were placed on in the right lateral space on in the right subcostal space. Marcaine was infused to the subxiphoid space and lateral upper right abdomen in the transversus abdominis plane. A preperitoneal mesh was seen around the umbilical area. Next the patient was placed in reverse trendelenberg. The gallbladder appearedacutely inflamed. Omentum was adhered to the gallbladder and was taken down with cautery/blunt dissection.  The gallbladder was retracted cephalad and lateral. The peritoneum was reflected off the infundibulum working lateral to medial. The left subcostal incision was upsized to a 12 mm trocar. The cystic duct and cystic artery were identified and further dissection revealed a critical view, due to concern for choledocholithiasis a cholangiogram was performed with ductotomy and cook catheter passed through a separate subcostal stab incision. The CBD was visualized without filling  defect but did not empty into the duodenum. Glucagon was administered and IOC repeated with still no emptying into the duodenum. The cystic duct and cystic artery were doubly clipped and ligated.   The gallbladder was removed off the liver bed with cautery. The Gallbladder was placed in a specimen bag. The gallbladder fossa was irrigated and hemostasis was applied with cautery. The gallbladder was removed via the 40mm trocar. A 19 fr blake drain was placed with the tip in the gallbladder fossa and brought out the right lateral port site and sutured in place with a 2-0 Nylon, The fascial defect was closed with interrupted 0 vicryl suture via laparoscopic trans-fascial suture passer, and loose stones were removed. Pneumoperitoneum was removed, all trocar were removed. All incisions were closed with 4-0 monocryl subcuticular stitch. The patient woke from anesthesia and was brought to PACU in stable condition. All counts were correct  Findings: acute cholecystitis  Specimen: gallbladder  Blood loss: 50 ml  Local anesthesia: 20 ml Marcaine  Complications: none  PLAN OF CARE: Admit to inpatient   PATIENT DISPOSITION:  PACU - hemodynamically stable.  Feliciana Rossetti, M.D. General, Bariatric, & Minimally Invasive Surgery [facility] [place]

## 2021-06-12 NOTE — Progress Notes (Signed)
PROGRESS NOTE  Jesse Hanson POE:[id number] DOB: 1986-07-24 DOA: 06/10/2021 PCP: Gaspar Garbe, MD  HPI/Recap of past 50 hours: 35 year old with past medical history of Crohn's disease who presented to the hospital with abdominal pain and concern for hepatobiliary disease he was found to have choledocholithiasis and cholecystitis.  Patient seen and examined at bedside he just returned from surgery.  He just underwent laparoscopic cholecystectomy with intraoperative cholangiogram  Assessment/Plan: Principal Problem:   Right upper quadrant pain Active Problems:   Crohn's disease of both small and large intestine without complications (HCC)   1.  Cholecystitis status post laparoscopic cholecystectomy with intraoperative cholangiogram  2.  Hepatitis due to cholecystitis with possible gallstone.  He is HIDA scan did not reveal any stone this may just be due to gallbladder edema causing common bile duct cystic duct obstruction  3.  Obesity patient will benefit from weight loss  4.  Crohn's disease well controlled on current medication  Code Status: Full  Severity of Illness: The appropriate patient status for this patient is INPATIENT. Inpatient status is judged to be reasonable and necessary in order to provide the required intensity of service to ensure the patient's safety. The patient's presenting symptoms, physical exam findings, and initial radiographic and laboratory data in the context of their chronic comorbidities is felt to place them at high risk for further clinical deterioration. Furthermore, it is not anticipated that the patient will be medically stable for discharge from the hospital within 2 midnights of admission. The following factors support the patient status of inpatient.  Cholecystitis status postcholecystectomy   * I certify that at the point of admission it is my clinical judgment that the patient will require inpatient hospital care spanning beyond 2  midnights from the point of admission due to high intensity of service, high risk for further deterioration and high frequency of surveillance required.*   Family Communication: None at bedside  Disposition Plan: Home in 1 to 2 days   Consultants: General surgery  Procedures: laparoscopic cholecystectomy with intraoperative cholangiogram  MRCP  Antimicrobials: Zosyn  DVT prophylaxis: SCD   Objective: Vitals:   06/11/21 1021 06/11/21 2152 06/12/21 0509 06/12/21 0849  BP: (!) 142/97 (!) 141/98 133/90 (!) 148/100  Pulse: 73 69 65 71  Resp: 18  18 20   Temp: 98.8 F (37.1 C) 98.6 F (37 C) 97.8 F (36.6 C) 98.1 F (36.7 C)  TempSrc: Oral  Oral Oral  SpO2: 99% 98% 99% 97%    Intake/Output Summary (Last 24 hours) at 06/12/2021 1109 Last data filed at 06/12/2021 1046 Gross per 24 hour  Intake 207.64 ml  Output 50 ml  Net 157.64 ml   There were no vitals filed for this visit. There is no height or weight on file to calculate BMI.  Exam:  General: 35 y.o. year-old male well developed well nourished in no acute distress.  Alert and oriented x3. Cardiovascular: Regular rate and rhythm with no rubs or gallops.  No thyromegaly or JVD noted.   Respiratory: Clear to auscultation with no wheezes or rales. Good inspiratory effort. Abdomen: Soft mild tender nondistended with normal bowel sounds x4 quadrants. Musculoskeletal: No lower extremity edema. 2/4 pulses in all 4 extremities. Skin: No ulcerative lesions noted or rashes, Psychiatry: Mood is appropriate for condition and setting    Data Reviewed: CBC: Recent Labs  Lab 06/06/21 2228 06/10/21 2252 06/11/21 0711 06/12/21 0605  WBC 13.9* 13.5* 9.7 8.1  NEUTROABS  --   --  7.5  --  HGB 15.2 14.5 12.9* 12.1*  HCT 43.9 41.5 37.7* 35.8*  MCV 93.4 93.3 96.2 97.0  PLT 428* 396 322 350   Basic Metabolic Panel: Recent Labs  Lab 06/06/21 2228 06/10/21 2252 06/11/21 0711 06/12/21 0605  NA 139 138 138 139  K 3.8 3.6  3.7 3.6  CL 101 100 104 106  CO2 26 28 26 26   GLUCOSE 143* 98 102* 82  BUN 9 10 5* 6  CREATININE 1.03 0.82 0.80 0.90  CALCIUM 9.8 9.9 8.6* 8.5*   GFR: Estimated Creatinine Clearance: 140.5 mL/min (by C-G formula based on SCr of 0.9 mg/dL). Liver Function Tests: Recent Labs  Lab 06/06/21 2228 06/10/21 2252 06/11/21 0711 06/12/21 0605  AST 49* 39 271* 239*  ALT 71* 29 160* 283*  ALKPHOS 60 81 110 99  BILITOT 0.9 1.3* 3.1* 3.6*  PROT 8.1 8.2* 6.4* 6.2*  ALBUMIN 4.2 4.2 2.7* 2.5*   Recent Labs  Lab 06/06/21 2228  LIPASE 11   No results for input(s): AMMONIA in the last 168 hours. Coagulation Profile: Recent Labs  Lab 06/11/21 0711  INR 1.1   Cardiac Enzymes: No results for input(s): CKTOTAL, CKMB, CKMBINDEX, TROPONINI in the last 168 hours. BNP (last 3 results) No results for input(s): PROBNP in the last 8760 hours. HbA1C: No results for input(s): HGBA1C in the last 72 hours. CBG: No results for input(s): GLUCAP in the last 168 hours. Lipid Profile: No results for input(s): CHOL, HDL, LDLCALC, TRIG, CHOLHDL, LDLDIRECT in the last 72 hours. Thyroid Function Tests: No results for input(s): TSH, T4TOTAL, FREET4, T3FREE, THYROIDAB in the last 72 hours. Anemia Panel: No results for input(s): VITAMINB12, FOLATE, FERRITIN, TIBC, IRON, RETICCTPCT in the last 72 hours. Urine analysis:    Component Value Date/Time   COLORURINE YELLOW 06/11/2021 0205   APPEARANCEUR HAZY (A) 06/11/2021 0205   LABSPEC 1.024 06/11/2021 0205   PHURINE 6.5 06/11/2021 0205   GLUCOSEU NEGATIVE 06/11/2021 0205   HGBUR NEGATIVE 06/11/2021 0205   BILIRUBINUR NEGATIVE 06/11/2021 0205   KETONESUR NEGATIVE 06/11/2021 0205   PROTEINUR TRACE (A) 06/11/2021 0205   NITRITE NEGATIVE 06/11/2021 0205   LEUKOCYTESUR NEGATIVE 06/11/2021 0205   Sepsis Labs: @LABRCNTIP (procalcitonin:4,lacticidven:4)  ) Recent Results (from the past 240 hour(s))  Resp Panel by RT-PCR (Flu A&B, Covid) Nasopharyngeal Swab      Status: None   Collection Time: 06/11/21 12:10 AM   Specimen: Nasopharyngeal Swab; Nasopharyngeal(NP) swabs in vial transport medium  Result Value Ref Range Status   SARS Coronavirus 2 by RT PCR NEGATIVE NEGATIVE Final    Comment: (NOTE) SARS-CoV-2 target nucleic acids are NOT DETECTED.  The SARS-CoV-2 RNA is generally detectable in upper respiratory specimens during the acute phase of infection. The lowest concentration of SARS-CoV-2 viral copies this assay can detect is 138 copies/mL. A negative result does not preclude SARS-Cov-2 infection and should not be used as the sole basis for treatment or other patient management decisions. A negative result may occur with  improper specimen collection/handling, submission of specimen other than nasopharyngeal swab, presence of viral mutation(s) within the areas targeted by this assay, and inadequate number of viral copies(<138 copies/mL). A negative result must be combined with clinical observations, patient history, and epidemiological information. The expected result is Negative.  Fact Sheet for Patients:   Fact Sheet for Healthcare Providers:  06/13/21  This test is no t yet approved or cleared by the [employer] FDA and  has been authorized for detection and/or diagnosis of SARS-CoV-2 by  FDA under an Emergency Use Authorization (EUA). This EUA will remain  in effect (meaning this test can be used) for the duration of the COVID-19 declaration under Section 564(b)(1) of the Act, 21 U.S.C.section 360bbb-3(b)(1), unless the authorization is terminated  or revoked sooner.       Influenza A by PCR NEGATIVE NEGATIVE Final   Influenza B by PCR NEGATIVE NEGATIVE Final    Comment: (NOTE) The Xpert Xpress SARS-CoV-2/FLU/RSV plus assay is intended as an aid in the diagnosis of influenza from Nasopharyngeal swab specimens and should not be used as a sole  basis for treatment. Nasal washings and aspirates are unacceptable for Xpert Xpress SARS-CoV-2/FLU/RSV testing.  Fact Sheet for Patients: [employer]Course.comhttps://www.fda.gov/media/152166/download  Fact Sheet for Healthcare Providers: SeriousBroker.ithttps://www.fda.gov/media/152162/download  This test is not yet approved or cleared by the [place] FDA and has been authorized for detection and/or diagnosis of SARS-CoV-2 by FDA under an Emergency Use Authorization (EUA). This EUA will remain in effect (meaning this test can be used) for the duration of the COVID-19 declaration under Section 564(b)(1) of the Act, 21 U.S.C. section 360bbb-3(b)(1), unless the authorization is terminated or revoked.  Performed at [facility], [address], [place], [place] [address]       Studies: NM Hepatobiliary Liver Func  Result Date: 06/11/2021 CLINICAL DATA:  Right upper quadrant abdominal pain and nausea. EXAM: NUCLEAR MEDICINE HEPATOBILIARY IMAGING TECHNIQUE: Sequential images of the abdomen were obtained out to 240 minutes following intravenous administration of radiopharmaceutical. RADIOPHARMACEUTICALS:  3.1 mCi [id number]  Choletec IV COMPARISON:  Abdomen MRI/MRCP on 06/11/2021 FINDINGS: Prompt radiopharmaceutical uptake is seen within the hepatic parenchyma. There is no biliary excretion of activity by the liver at imaging performed out to 4 hours, consistent with common bile duct obstruction. Therefore, cystic duct patency cannot be assessed on this exam, however there are findings of acute cholecystitis on the MRI performed earlier today. IMPRESSION: Findings demonstrate obstruction of the common bile duct. Therefore, cystic duct patency cannot be assessed on this exam; however the MRI performed earlier today shows findings of acute cholecystitis. These results were called by telephone at the time of interpretation on 06/11/2021 at 7:29 pm to provider [facility] , who verbally acknowledged these  results. Electronically Signed   By: Danae OrleansJohn A Stahl M.D.   On: 06/11/2021 19:31   MR 3D Recon At Scanner  Result Date: 06/11/2021 CLINICAL DATA:  Right upper quadrant abdominal pain EXAM: MRI ABDOMEN WITHOUT AND WITH CONTRAST (INCLUDING MRCP) TECHNIQUE: Multiplanar multisequence MR imaging of the abdomen was performed both before and after the administration of intravenous contrast. Heavily T2-weighted images of the biliary and pancreatic ducts were obtained, and three-dimensional MRCP images were rendered by post processing. CONTRAST:  10mL GADAVIST GADOBUTROL 1 MMOL/ML IV SOLN COMPARISON:  CT abdomen pelvis, 06/06/2021 FINDINGS: Lower chest: No acute findings. Hepatobiliary: Hepatic steatosis. No mass or other parenchymal abnormality identified. Mild periportal edema. Wall thickening and fat stranding about the gallbladder (series 8, image 20). Numerous small calculi in the dependent gallbladder. No biliary ductal dilatation. Probable calculi and/or sludge in the gallbladder neck (series 16, image 11). Pancreas: No mass, inflammatory changes, or other parenchymal abnormality identified. Spleen:  Within normal limits in size and appearance. Adrenals/Urinary Tract: No masses identified. No evidence of hydronephrosis. Stomach/Bowel: Visualized portions within the abdomen are unremarkable. Vascular/Lymphatic: No pathologically enlarged lymph nodes identified. No abdominal aortic aneurysm demonstrated. Other:  None. Musculoskeletal: No suspicious bone lesions identified. IMPRESSION: 1. Cholelithiasis with gallbladder wall thickening and fat stranding  about the gallbladder and mild periportal edema. Findings are consistent with acute cholecystitis. 2. Probable calculi and/or sludge in the gallbladder neck. 3. No biliary ductal dilatation or common bile duct calculi identified. 4. Hepatic steatosis. Electronically Signed   By: Lauralyn Primes M.D.   On: 06/11/2021 14:02   MR ABDOMEN MRCP W WO CONTAST  Result Date:  06/11/2021 CLINICAL DATA:  Right upper quadrant abdominal pain EXAM: MRI ABDOMEN WITHOUT AND WITH CONTRAST (INCLUDING MRCP) TECHNIQUE: Multiplanar multisequence MR imaging of the abdomen was performed both before and after the administration of intravenous contrast. Heavily T2-weighted images of the biliary and pancreatic ducts were obtained, and three-dimensional MRCP images were rendered by post processing. CONTRAST:  30mL GADAVIST GADOBUTROL 1 MMOL/ML IV SOLN COMPARISON:  CT abdomen pelvis, 06/06/2021 FINDINGS: Lower chest: No acute findings. Hepatobiliary: Hepatic steatosis. No mass or other parenchymal abnormality identified. Mild periportal edema. Wall thickening and fat stranding about the gallbladder (series 8, image 20). Numerous small calculi in the dependent gallbladder. No biliary ductal dilatation. Probable calculi and/or sludge in the gallbladder neck (series 16, image 11). Pancreas: No mass, inflammatory changes, or other parenchymal abnormality identified. Spleen:  Within normal limits in size and appearance. Adrenals/Urinary Tract: No masses identified. No evidence of hydronephrosis. Stomach/Bowel: Visualized portions within the abdomen are unremarkable. Vascular/Lymphatic: No pathologically enlarged lymph nodes identified. No abdominal aortic aneurysm demonstrated. Other:  None. Musculoskeletal: No suspicious bone lesions identified. IMPRESSION: 1. Cholelithiasis with gallbladder wall thickening and fat stranding about the gallbladder and mild periportal edema. Findings are consistent with acute cholecystitis. 2. Probable calculi and/or sludge in the gallbladder neck. 3. No biliary ductal dilatation or common bile duct calculi identified. 4. Hepatic steatosis. Electronically Signed   By: Lauralyn Primes M.D.   On: 06/11/2021 14:02    Scheduled Meds:  [MAR Hold] azaTHIOprine  100 mg Oral Daily    Continuous Infusions:  lactated ringers     [MAR Hold] piperacillin-tazobactam (ZOSYN)  IV 12.5  mL/hr at 06/12/21 0645     LOS: 0 days     Myrtie Neither, MD [facility]  To reach me or the doctor on call, go to: [email or web address] Password [facility]  06/12/2021, 11:09 AM

## 2021-06-13 ENCOUNTER — Inpatient Hospital Stay (HOSPITAL_COMMUNITY): Payer: Managed Care, Other (non HMO)

## 2021-06-13 ENCOUNTER — Encounter (HOSPITAL_COMMUNITY)
Admission: EM | Disposition: A | Discharge status: Home / Self Care | Payer: Self-pay | Source: Home / Self Care | Attending: Family Medicine

## 2021-06-13 ENCOUNTER — Inpatient Hospital Stay (HOSPITAL_COMMUNITY): Payer: Managed Care, Other (non HMO) | Admitting: Anesthesiology

## 2021-06-13 ENCOUNTER — Encounter (HOSPITAL_COMMUNITY): Payer: Self-pay | Admitting: General Surgery

## 2021-06-13 DIAGNOSIS — R17 Unspecified jaundice: Secondary | ICD-10-CM

## 2021-06-13 DIAGNOSIS — K2970 Gastritis, unspecified, without bleeding: Secondary | ICD-10-CM

## 2021-06-13 DIAGNOSIS — R1011 Right upper quadrant pain: Secondary | ICD-10-CM | POA: Diagnosis not present

## 2021-06-13 HISTORY — PX: SPHINCTEROTOMY: SHX5544

## 2021-06-13 HISTORY — PX: ERCP: SHX5425

## 2021-06-13 HISTORY — PX: PANCREATIC STENT PLACEMENT: SHX5539

## 2021-06-13 HISTORY — PX: BIOPSY: SHX5522

## 2021-06-13 HISTORY — PX: REMOVAL OF STONES: SHX5545

## 2021-06-13 LAB — COMPREHENSIVE METABOLIC PANEL
ALT: 379 U/L — ABNORMAL HIGH (ref 0–44)
AST: 351 U/L — ABNORMAL HIGH (ref 15–41)
Albumin: 2.5 g/dL — ABNORMAL LOW (ref 3.5–5.0)
Alkaline Phosphatase: 114 U/L (ref 38–126)
Anion gap: 11 (ref 5–15)
BUN: <5 mg/dL — ABNORMAL LOW (ref 6–20)
CO2: 27 mmol/L (ref 22–32)
Calcium: 8.6 mg/dL — ABNORMAL LOW (ref 8.9–10.3)
Chloride: 99 mmol/L (ref 98–111)
Creatinine, Ser: 0.93 mg/dL (ref 0.61–1.24)
GFR, Estimated: >60 mL/min (ref >60)
Glucose, Bld: 106 mg/dL — ABNORMAL HIGH (ref 70–99)
Potassium: 3.2 mmol/L — ABNORMAL LOW (ref 3.5–5.1)
Sodium: 137 mmol/L (ref 135–145)
Total Bilirubin: 5 mg/dL — ABNORMAL HIGH (ref 0.3–1.2)
Total Protein: 6.1 g/dL — ABNORMAL LOW (ref 6.5–8.1)

## 2021-06-13 LAB — CBC
HCT: 35.8 % — ABNORMAL LOW (ref 39.0–52.0)
Hemoglobin: 12.1 g/dL — ABNORMAL LOW (ref 13.0–17.0)
MCH: 32.3 pg (ref 26.0–34.0)
MCHC: 33.8 g/dL (ref 30.0–36.0)
MCV: 95.5 fL (ref 80.0–100.0)
Platelets: 376 10*3/uL (ref 150–400)
RBC: 3.75 MIL/uL — ABNORMAL LOW (ref 4.22–5.81)
RDW: 13.1 % (ref 11.5–15.5)
WBC: 9.7 10*3/uL (ref 4.0–10.5)
nRBC: 0 % (ref 0.0–0.2)

## 2021-06-13 SURGERY — ERCP, WITH INTERVENTION IF INDICATED
Anesthesia: General

## 2021-06-13 MED ORDER — ONDANSETRON HCL 4 MG/2ML IJ SOLN
INTRAMUSCULAR | Status: DC | PRN
  Administered 2021-06-13: 4 mg via INTRAVENOUS

## 2021-06-13 MED ORDER — SODIUM CHLORIDE 0.9 % IV SOLN: INTRAVENOUS | Status: DC

## 2021-06-13 MED ORDER — PROPOFOL 10 MG/ML IV BOLUS
INTRAVENOUS | Status: DC | PRN
  Administered 2021-06-13: 200 mg via INTRAVENOUS

## 2021-06-13 MED ORDER — GLUCAGON HCL RDNA (DIAGNOSTIC) 1 MG IJ SOLR
INTRAMUSCULAR | Status: DC | PRN
  Administered 2021-06-13 (×3): .25 mg via INTRAVENOUS

## 2021-06-13 MED ORDER — INDOMETHACIN 50 MG RE SUPP
RECTAL | Status: DC | PRN
Start: 2021-06-13 — End: 2021-06-13
  Administered 2021-06-13: 100 mg via RECTAL

## 2021-06-13 MED ORDER — FENTANYL CITRATE (PF) 100 MCG/2ML IJ SOLN
INTRAMUSCULAR | Status: AC
  Filled 2021-06-13: qty 2

## 2021-06-13 MED ORDER — DEXAMETHASONE SODIUM PHOSPHATE 10 MG/ML IJ SOLN
INTRAMUSCULAR | Status: DC | PRN
  Administered 2021-06-13: 4 mg via INTRAVENOUS

## 2021-06-13 MED ORDER — ROCURONIUM BROMIDE 10 MG/ML (PF) SYRINGE
PREFILLED_SYRINGE | INTRAVENOUS | Status: DC | PRN
  Administered 2021-06-13: 50 mg via INTRAVENOUS

## 2021-06-13 MED ORDER — LISINOPRIL 10 MG PO TABS
10.0000 mg | ORAL_TABLET | Freq: Every day | ORAL | Status: DC
  Administered 2021-06-13 – 2021-06-14 (×2): 10 mg via ORAL
  Filled 2021-06-13 (×2): qty 1

## 2021-06-13 MED ORDER — POTASSIUM CHLORIDE CRYS ER 20 MEQ PO TBCR
40.0000 meq | EXTENDED_RELEASE_TABLET | Freq: Once | ORAL | Status: AC
  Administered 2021-06-13: 40 meq via ORAL
  Filled 2021-06-13: qty 2

## 2021-06-13 MED ORDER — LIDOCAINE 2% (20 MG/ML) 5 ML SYRINGE
INTRAMUSCULAR | Status: DC | PRN
  Administered 2021-06-13: 60 mg via INTRAVENOUS

## 2021-06-13 MED ORDER — PROMETHAZINE HCL 25 MG/ML IJ SOLN: 6.2500 mg | INTRAMUSCULAR | Status: DC | PRN

## 2021-06-13 MED ORDER — SODIUM CHLORIDE 0.9 % IV SOLN
INTRAVENOUS | Status: DC | PRN
  Administered 2021-06-13: 15 mL

## 2021-06-13 MED ORDER — GLUCAGON HCL RDNA (DIAGNOSTIC) 1 MG IJ SOLR
INTRAMUSCULAR | Status: AC
  Filled 2021-06-13: qty 1

## 2021-06-13 MED ORDER — LACTATED RINGERS IV SOLN: INTRAVENOUS | Status: DC | PRN

## 2021-06-13 MED ORDER — FENTANYL CITRATE (PF) 100 MCG/2ML IJ SOLN
INTRAMUSCULAR | Status: DC | PRN
  Administered 2021-06-13: 100 ug via INTRAVENOUS

## 2021-06-13 MED ORDER — FENTANYL CITRATE (PF) 100 MCG/2ML IJ SOLN: 25.0000 ug | INTRAMUSCULAR | Status: DC | PRN

## 2021-06-13 MED ORDER — SUGAMMADEX SODIUM 200 MG/2ML IV SOLN
INTRAVENOUS | Status: DC | PRN
  Administered 2021-06-13: 400 mg via INTRAVENOUS

## 2021-06-13 MED ORDER — INDOMETHACIN 50 MG RE SUPP
RECTAL | Status: AC
  Filled 2021-06-13: qty 2

## 2021-06-13 NOTE — Anesthesia Preprocedure Evaluation (Addendum)
Anesthesia Evaluation  Patient identified by MRN, date of birth, ID band Patient awake    Reviewed: Allergy & Precautions, NPO status , Patient's Chart, lab work & pertinent test results  History of Anesthesia Complications Negative for: history of anesthetic complications  Airway Mallampati: II  TM Distance: >3 FB Neck ROM: Full    Dental  (+) Dental Advisory Given   Pulmonary neg pulmonary ROS,    Pulmonary exam normal        Cardiovascular negative cardio ROS Normal cardiovascular exam     Neuro/Psych negative neurological ROS  negative psych ROS   GI/Hepatic (+)     substance abuse  alcohol use,  Crohn's disease    Endo/Other   Obesity K 3.2   Renal/GU negative Renal ROS     Musculoskeletal negative musculoskeletal ROS (+)   Abdominal   Peds  Hematology  (+) anemia ,   Anesthesia Other Findings S/p cholecystectomy yesterday   Reproductive/Obstetrics                            Anesthesia Physical Anesthesia Plan  ASA: 2  Anesthesia Plan: General   Post-op Pain Management:    Induction: Intravenous  PONV Risk Score and Plan: 2 and Treatment may vary due to age or medical condition, Ondansetron, Dexamethasone and Midazolam  Airway Management Planned: Oral ETT  Additional Equipment: None  Intra-op Plan:   Post-operative Plan: Extubation in OR  Informed Consent: I have reviewed the patients History and Physical, chart, labs and discussed the procedure including the risks, benefits and alternatives for the proposed anesthesia with the patient or authorized representative who has indicated his/her understanding and acceptance.     Dental advisory given  Plan Discussed with: CRNA and Anesthesiologist  Anesthesia Plan Comments:        Anesthesia Quick Evaluation

## 2021-06-13 NOTE — Progress Notes (Addendum)
PROGRESS NOTE  Rad Gramling OEV:[id number] DOB: [birth year] DOA: 06/10/2021 PCP: Gaspar Garbe, MD  HPI/Recap of past 45 hours: 35 year old with past medical history of Crohn's disease who presented to the hospital with abdominal pain and concern for hepatobiliary disease he was found to have choledocholithiasis and cholecystitis.  Patient seen and examined at bedside he just returned from surgery.  He just underwent laparoscopic cholecystectomy with intraoperative cholangiogram  UPDATE 06/13/2021 Patient seen and examined at bedside  he had  ERCP today PER  GI :ERCP complete.  sphincterotomy in place.  Some sludge removed.  Query some ampullary stenosis as no overt stone.  No leak.  pancreatic stent placed for Post ERCP pancreatitis prevention.  I'll get a KUB in 2-weeks to ensure it has fallen out.  He will need to follow up with his primary GI ([facility]) for his chronic GI issues (Crohn's disease).  My team will pass by to see in AM.  Hold VTE PPx for at least 24 hours to prevent bleeding.      Assessment/Plan: Principal Problem:   Right upper quadrant pain Active Problems:   Crohn's disease of both small and large intestine without complications (HCC)   1.  Cholecystitis status post laparoscopic cholecystectomy with intraoperative cholangiogram  2.  Hepatitis due to cholecystitis with possible gallstone.  He is HIDA scan did not reveal any stone this may just be due to gallbladder edema causing common bile duct cystic duct obstruction  3.  Obesity patient will benefit from weight loss  4.  Crohn's disease well controlled on current medication, on Azathioprine and Stelara followed by Dr. Kinnie Scales -Continue follow-up with Dr. Kinnie Scales as outpatient  5.  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  Code Status: Full  Severity of Illness: The appropriate patient status for this patient is INPATIENT. Inpatient status is judged to be  reasonable and necessary in order to provide the required intensity of service to ensure the patient's safety. The patient's presenting symptoms, physical exam findings, and initial radiographic and laboratory data in the context of their chronic comorbidities is felt to place them at high risk for further clinical deterioration. Furthermore, it is not anticipated that the patient will be medically stable for discharge from the hospital within 2 midnights of admission. The following factors support the patient status of inpatient.  Cholecystitis status postcholecystectomy   * I certify that at the point of admission it is my clinical judgment that the patient will require inpatient hospital care spanning beyond 2 midnights from the point of admission due to high intensity of service, high risk for further deterioration and high frequency of surveillance required.*   Family Communication: None at bedside  Disposition Plan: Home in 1 to 2 days   Consultants: General surgery  Procedures: laparoscopic cholecystectomy with intraoperative cholangiogram  MRCP  Antimicrobials: Zosyn  DVT prophylaxis: SCD   Objective: Vitals:   06/12/21 1440 06/12/21 2228 06/13/21 0155 06/13/21 0527  BP: (!) 155/90 (!) 152/91 (!) 147/88 (!) 151/93  Pulse: 88 65 74 83  Resp: 18 18 17 17   Temp: 97.8 F (36.6 C) 99 F (37.2 C) 98.2 F (36.8 C) 98.6 F (37 C)  TempSrc: Oral Oral Oral Oral  SpO2: 98% 97% 96% 96%    Intake/Output Summary (Last 24 hours) at 06/13/2021 0859 Last data filed at 06/13/2021 06/15/2021 Gross per 24 hour  Intake 2300 ml  Output 95 ml  Net 2205 ml  There were no vitals filed for this visit. There is no height or weight on file to calculate BMI.  Exam:  General: 35 y.o. year-old male well developed well nourished in no acute distress.  Alert and oriented x3. Cardiovascular: Regular rate and rhythm with no rubs or gallops.  No thyromegaly or JVD noted.   Respiratory: Clear to  auscultation with no wheezes or rales. Good inspiratory effort. Abdomen: Soft mild tender nondistended with normal bowel sounds x4 quadrants. Musculoskeletal: No lower extremity edema. 2/4 pulses in all 4 extremities. Skin: No ulcerative lesions noted.  Erythematous rashes noted on face, Psychiatry: Mood is appropriate for condition and setting    Data Reviewed: CBC: Recent Labs  Lab 06/06/21 2228 06/10/21 2252 06/11/21 0711 06/12/21 0605 06/13/21 0214  WBC 13.9* 13.5* 9.7 8.1 9.7  NEUTROABS  --   --  7.5  --   --   HGB 15.2 14.5 12.9* 12.1* 12.1*  HCT 43.9 41.5 37.7* 35.8* 35.8*  MCV 93.4 93.3 96.2 97.0 95.5  PLT 428* 396 322 350 376    Basic Metabolic Panel: Recent Labs  Lab 06/06/21 2228 06/10/21 2252 06/11/21 0711 06/12/21 0605 06/13/21 0214  NA 139 138 138 139 137  K 3.8 3.6 3.7 3.6 3.2*  CL 101 100 104 106 99  CO2 26 28 26 26 27   GLUCOSE 143* 98 102* 82 106*  BUN 9 10 5* 6 <5*  CREATININE 1.03 0.82 0.80 0.90 0.93  CALCIUM 9.8 9.9 8.6* 8.5* 8.6*    GFR: Estimated Creatinine Clearance: 136 mL/min (by C-G formula based on SCr of 0.93 mg/dL). Liver Function Tests: Recent Labs  Lab 06/06/21 2228 06/10/21 2252 06/11/21 0711 06/12/21 0605 06/13/21 0214  AST 49* 39 271* 239* 351*  ALT 71* 29 160* 283* 379*  ALKPHOS 60 81 110 99 114  BILITOT 0.9 1.3* 3.1* 3.6* 5.0*  PROT 8.1 8.2* 6.4* 6.2* 6.1*  ALBUMIN 4.2 4.2 2.7* 2.5* 2.5*    Recent Labs  Lab 06/06/21 2228  LIPASE 11    No results for input(s): AMMONIA in the last 168 hours. Coagulation Profile: Recent Labs  Lab 06/11/21 0711  INR 1.1    Cardiac Enzymes: No results for input(s): CKTOTAL, CKMB, CKMBINDEX, TROPONINI in the last 168 hours. BNP (last 3 results) No results for input(s): PROBNP in the last 8760 hours. HbA1C: No results for input(s): HGBA1C in the last 72 hours. CBG: Recent Labs  Lab 06/12/21 2059  GLUCAP 110*   Lipid Profile: No results for input(s): CHOL, HDL,  LDLCALC, TRIG, CHOLHDL, LDLDIRECT in the last 72 hours. Thyroid Function Tests: No results for input(s): TSH, T4TOTAL, FREET4, T3FREE, THYROIDAB in the last 72 hours. Anemia Panel: No results for input(s): VITAMINB12, FOLATE, FERRITIN, TIBC, IRON, RETICCTPCT in the last 72 hours. Urine analysis:    Component Value Date/Time   COLORURINE YELLOW 06/11/2021 0205   APPEARANCEUR HAZY (A) 06/11/2021 0205   LABSPEC 1.024 06/11/2021 0205   PHURINE 6.5 06/11/2021 0205   GLUCOSEU NEGATIVE 06/11/2021 0205   HGBUR NEGATIVE 06/11/2021 0205   BILIRUBINUR NEGATIVE 06/11/2021 0205   KETONESUR NEGATIVE 06/11/2021 0205   PROTEINUR TRACE (A) 06/11/2021 0205   NITRITE NEGATIVE 06/11/2021 0205   LEUKOCYTESUR NEGATIVE 06/11/2021 0205   Sepsis Labs: @LABRCNTIP (procalcitonin:4,lacticidven:4)  ) Recent Results (from the past 240 hour(s))  Resp Panel by RT-PCR (Flu A&B, Covid) Nasopharyngeal Swab     Status: None   Collection Time: 06/11/21 12:10 AM   Specimen: Nasopharyngeal Swab; Nasopharyngeal(NP) swabs in vial  transport medium  Result Value Ref Range Status   SARS Coronavirus 2 by RT PCR NEGATIVE NEGATIVE Final    Comment: (NOTE) SARS-CoV-2 target nucleic acids are NOT DETECTED.  The SARS-CoV-2 RNA is generally detectable in upper respiratory specimens during the acute phase of infection. The lowest concentration of SARS-CoV-2 viral copies this assay can detect is 138 copies/mL. A negative result does not preclude SARS-Cov-2 infection and should not be used as the sole basis for treatment or other patient management decisions. A negative result may occur with  improper specimen collection/handling, submission of specimen other than nasopharyngeal swab, presence of viral mutation(s) within the areas targeted by this assay, and inadequate number of viral copies(<138 copies/mL). A negative result must be combined with clinical observations, patient history, and epidemiological information. The  expected result is Negative.  Fact Sheet for Patients:  BloggerCourse.com  Fact Sheet for Healthcare Providers:  SeriousBroker.it  This test is no t yet approved or cleared by the [place] FDA and  has been authorized for detection and/or diagnosis of SARS-CoV-2 by FDA under an Emergency Use Authorization (EUA). This EUA will remain  in effect (meaning this test can be used) for the duration of the COVID-19 declaration under Section 564(b)(1) of the Act, 21 U.S.C.section 360bbb-3(b)(1), unless the authorization is terminated  or revoked sooner.       Influenza A by PCR NEGATIVE NEGATIVE Final   Influenza B by PCR NEGATIVE NEGATIVE Final    Comment: (NOTE) The Xpert Xpress SARS-CoV-2/FLU/RSV plus assay is intended as an aid in the diagnosis of influenza from Nasopharyngeal swab specimens and should not be used as a sole basis for treatment. Nasal washings and aspirates are unacceptable for Xpert Xpress SARS-CoV-2/FLU/RSV testing.  Fact Sheet for Patients: BloggerCourse.com  Fact Sheet for Healthcare Providers: SeriousBroker.it  This test is not yet approved or cleared by the [place] FDA and has been authorized for detection and/or diagnosis of SARS-CoV-2 by FDA under an Emergency Use Authorization (EUA). This EUA will remain in effect (meaning this test can be used) for the duration of the COVID-19 declaration under Section 564(b)(1) of the Act, 21 U.S.C. section 360bbb-3(b)(1), unless the authorization is terminated or revoked.  Performed at [facility], [address] [place], [place] [address]       Studies: DG Cholangiogram Operative  Result Date: 06/12/2021 CLINICAL DATA:  Cholelithiasis. EXAM: INTRAOPERATIVE CHOLANGIOGRAM TECHNIQUE: Cholangiographic images from the C-arm fluoroscopic device were submitted for interpretation  post-operatively. Please see the procedural report for the amount of contrast and the fluoroscopy time utilized. COMPARISON:  MRI 06/11/2021 FINDINGS: Cannulation and opacification of the cystic duct. Contrast fills the cystic duct, common hepatic duct and common bile duct. There is obstruction in the distal common bile duct. Small amount of contrast extravasation at the cystic duct cannulation site. No large filling defects within the opacified extrahepatic biliary system. Contrast does not drain into the duodenum. IMPRESSION: Obstruction of the distal common bile duct. Contrast never drains into the duodenum. Uncertain etiology for the distal common bile duct obstruction. Small amount of contrast extravasation at the cannulation site. Electronically Signed   By: Richarda Overlie M.D.   On: 06/12/2021 11:42    Scheduled Meds:  azaTHIOprine  100 mg Oral Daily   lisinopril  10 mg Oral Daily   potassium chloride  40 mEq Oral Once    Continuous Infusions:  lactated ringers 100 mL/hr at 06/13/21 0527   piperacillin-tazobactam (ZOSYN)  IV 3.375 g (06/13/21 0527)  LOS: 1 day     Myrtie NeitherNwannadiya Delano Scardino, MD [facility]  To reach me or the doctor on call, go to: [email or web address] Password [email or web address]  06/13/2021, 8:59 AM

## 2021-06-13 NOTE — Anesthesia Postprocedure Evaluation (Signed)
Anesthesia Post Note  Patient: Jesse Hanson  Procedure(s) Performed: ENDOSCOPIC RETROGRADE CHOLANGIOPANCREATOGRAPHY (ERCP) SPHINCTEROTOMY BIOPSY PANCREATIC STENT PLACEMENT     Patient location during evaluation: PACU Anesthesia Type: General Level of consciousness: awake and alert Pain management: pain level controlled Vital Signs Assessment: post-procedure vital signs reviewed and stable Respiratory status: spontaneous breathing, nonlabored ventilation and respiratory function stable Cardiovascular status: blood pressure returned to baseline and stable Postop Assessment: no apparent nausea or vomiting Anesthetic complications: no   No notable events documented.  Last Vitals:  Vitals:   06/13/21 1236 06/13/21 1417  BP: (!) 144/99 119/84  Pulse: 69 83  Resp: 12 (!) 23  Temp: 37.1 C 36.5 C  SpO2: 99% 100%    Last Pain:  Vitals:   06/13/21 1417  TempSrc:   PainSc: 0-No pain                 Beryle Lathe

## 2021-06-13 NOTE — Progress Notes (Signed)
[facility] Progress Note:   1 Day Post-Op  Subjective: Mental status is clear.  Complaints no complaints. Objective: Vital signs in last 24 hours: Temp:  [97.8 F (36.6 C)-99 F (37.2 C)] 98 F (36.7 C) (06/19 0908) Pulse Rate:  [65-88] 81 (06/19 0908) Resp:  [16-18] 17 (06/19 0908) BP: (114-155)/(81-99) 150/88 (06/19 0908) SpO2:  [91 %-99 %] 99 % (06/19 0908)  Intake/Output from previous day: 06/18 0701 - 06/19 0700 In: 2300 [P.O.:300; I.V.:1800; IV Piggyback:200] Out: 95 [Drains:45; Blood:50] Intake/Output this shift: No intake/output data recorded.  Physical Exam: Work of breathing is normal.  Nontender and doing well except elevated LFTs suggest distal common bile duct stone.    Lab Results:  Results for orders placed or performed during the hospital encounter of 06/10/21 (from the past 48 hour(s))  CBC     Status: Abnormal   Collection Time: 06/12/21  6:05 AM  Result Value Ref Range   WBC 8.1 4.0 - 10.5 K/uL   RBC 3.69 (L) 4.22 - 5.81 MIL/uL   Hemoglobin 12.1 (L) 13.0 - 17.0 g/dL   HCT 28.4 (L) 13.2 - 44.0 %   MCV 97.0 80.0 - 100.0 fL   MCH 32.8 26.0 - 34.0 pg   MCHC 33.8 30.0 - 36.0 g/dL   RDW 10.2 72.5 - 36.6 %   Platelets 350 150 - 400 K/uL   nRBC 0.0 0.0 - 0.2 %    Comment: Performed at [facility], [address] [place], [place] [address]  Hepatic function panel Once     Status: Abnormal   Collection Time: 06/12/21  6:05 AM  Result Value Ref Range   Total Protein 6.2 (L) 6.5 - 8.1 g/dL   Albumin 2.5 (L) 3.5 - 5.0 g/dL   AST 742 (H) 15 - 41 U/L   ALT 283 (H) 0 - 44 U/L   Alkaline Phosphatase 99 38 - 126 U/L   Total Bilirubin 3.6 (H) 0.3 - 1.2 mg/dL   Bilirubin, Direct 2.3 (H) 0.0 - 0.2 mg/dL   Indirect Bilirubin 1.3 (H) 0.3 - 0.9 mg/dL    Comment: Performed at [facility], [address] [place], [place] [address]  Basic metabolic panel Once     Status: Abnormal   Collection Time: 06/12/21  6:05 AM  Result Value Ref Range    Sodium 139 135 - 145 mmol/L   Potassium 3.6 3.5 - 5.1 mmol/L   Chloride 106 98 - 111 mmol/L   CO2 26 22 - 32 mmol/L   Glucose, Bld 82 70 - 99 mg/dL    Comment: Glucose reference range applies only to samples taken after fasting for at least 8 hours.   BUN 6 6 - 20 mg/dL   Creatinine, Ser 8.75 0.61 - 1.24 mg/dL   Calcium 8.5 (L) 8.9 - 10.3 mg/dL   GFR, Estimated >64 >33 mL/min    Comment: (NOTE) Calculated using the CKD-EPI Creatinine Equation (2021)    Anion gap 7 5 - 15    Comment: Performed at [facility], [address], [place], [place] [address]  Glucose, capillary     Status: Abnormal   Collection Time: 06/12/21  8:59 PM  Result Value Ref Range   Glucose-Capillary 110 (H) 70 - 99 mg/dL    Comment: Glucose reference range applies only to samples taken after fasting for at least 8 hours.  Comprehensive metabolic panel     Status: Abnormal   Collection Time: 06/13/21  2:14 AM  Result Value Ref Range   Sodium 137 135 - 145 mmol/L   Potassium 3.2 (L) 3.5 - 5.1 mmol/L   Chloride 99 98 - 111 mmol/L   CO2 27 22 - 32 mmol/L   Glucose, Bld 106 (H) 70 - 99 mg/dL    Comment: Glucose reference range applies only to samples taken after fasting for at least 8 hours.   BUN <5 (L) 6 - 20 mg/dL   Creatinine, Ser 4.40 0.61 - 1.24 mg/dL   Calcium 8.6 (L) 8.9 - 10.3 mg/dL   Total Protein 6.1 (L) 6.5 - 8.1 g/dL   Albumin 2.5 (L) 3.5 - 5.0 g/dL   AST 347 (H) 15 - 41 U/L   ALT 379 (H) 0 - 44 U/L   Alkaline Phosphatase 114 38 - 126 U/L   Total Bilirubin 5.0 (H) 0.3 - 1.2 mg/dL   GFR, Estimated >42 >59 mL/min    Comment: (NOTE) Calculated using the CKD-EPI Creatinine Equation (2021)    Anion gap 11 5 - 15    Comment: Performed at [facility], [address], [place], [place] [address]  CBC     Status: Abnormal   Collection Time: 06/13/21  2:14 AM  Result Value Ref Range   WBC 9.7 4.0 - 10.5 K/uL   RBC 3.75 (L) 4.22 - 5.81 MIL/uL   Hemoglobin 12.1 (L) 13.0 - 17.0 g/dL   HCT  56.4 (L) 33.2 - 52.0 %   MCV 95.5 80.0 - 100.0 fL   MCH 32.3 26.0 - 34.0 pg   MCHC 33.8 30.0 - 36.0 g/dL   RDW 95.1 88.4 - 16.6 %   Platelets 376 150 - 400 K/uL   nRBC 0.0 0.0 - 0.2 %    Comment: Performed at [facility], [address], [place], [place] [address]    Radiology/Results: DG Cholangiogram Operative  Result Date: 06/12/2021 CLINICAL DATA:  Cholelithiasis. EXAM: INTRAOPERATIVE CHOLANGIOGRAM TECHNIQUE: Cholangiographic images from the C-arm fluoroscopic device were submitted for interpretation post-operatively. Please see the procedural report for the amount of contrast and the fluoroscopy time utilized. COMPARISON:  MRI 06/11/2021 FINDINGS: Cannulation and opacification of the cystic duct. Contrast fills the cystic duct, common hepatic duct and common bile duct. There is obstruction in the distal common bile duct. Small amount of contrast extravasation at the cystic duct cannulation site. No large filling defects within the opacified extrahepatic biliary system. Contrast does not drain into the duodenum. IMPRESSION: Obstruction of the distal common bile duct. Contrast never drains into the duodenum. Uncertain etiology for the distal common bile duct obstruction. Small amount of contrast extravasation at the cannulation site. Electronically Signed   By: Richarda Overlie M.D.   On: 06/12/2021 11:42   NM Hepatobiliary Liver Func  Result Date: 06/11/2021 CLINICAL DATA:  Right upper quadrant abdominal pain and nausea. EXAM: NUCLEAR MEDICINE HEPATOBILIARY IMAGING TECHNIQUE: Sequential images of the abdomen were obtained out to 240 minutes following intravenous administration of radiopharmaceutical. RADIOPHARMACEUTICALS:  3.1 mCi Tc-7m  Choletec IV COMPARISON:  Abdomen MRI/MRCP on 06/11/2021 FINDINGS: Prompt radiopharmaceutical uptake is seen within the hepatic parenchyma. There is no biliary excretion of activity by the liver at imaging performed out to 4 hours, consistent with common bile  duct obstruction. Therefore, cystic duct patency cannot be assessed on this exam, however there are findings of acute cholecystitis on the MRI performed earlier today. IMPRESSION: Findings demonstrate obstruction of the common bile duct. Therefore, cystic duct patency cannot be  assessed on this exam; however the MRI performed earlier today shows findings of acute cholecystitis. These results were called by telephone at the time of interpretation on 06/11/2021 at 7:29 pm to provider [facility] , who verbally acknowledged these results. Electronically Signed   By: Danae Orleans M.D.   On: 06/11/2021 19:31   MR 3D Recon At Scanner  Result Date: 06/11/2021 CLINICAL DATA:  Right upper quadrant abdominal pain EXAM: MRI ABDOMEN WITHOUT AND WITH CONTRAST (INCLUDING MRCP) TECHNIQUE: Multiplanar multisequence MR imaging of the abdomen was performed both before and after the administration of intravenous contrast. Heavily T2-weighted images of the biliary and pancreatic ducts were obtained, and three-dimensional MRCP images were rendered by post processing. CONTRAST:  45mL GADAVIST GADOBUTROL 1 MMOL/ML IV SOLN COMPARISON:  CT abdomen pelvis, 06/06/2021 FINDINGS: Lower chest: No acute findings. Hepatobiliary: Hepatic steatosis. No mass or other parenchymal abnormality identified. Mild periportal edema. Wall thickening and fat stranding about the gallbladder (series 8, image 20). Numerous small calculi in the dependent gallbladder. No biliary ductal dilatation. Probable calculi and/or sludge in the gallbladder neck (series 16, image 11). Pancreas: No mass, inflammatory changes, or other parenchymal abnormality identified. Spleen:  Within normal limits in size and appearance. Adrenals/Urinary Tract: No masses identified. No evidence of hydronephrosis. Stomach/Bowel: Visualized portions within the abdomen are unremarkable. Vascular/Lymphatic: No pathologically enlarged lymph nodes identified. No abdominal aortic aneurysm  demonstrated. Other:  None. Musculoskeletal: No suspicious bone lesions identified. IMPRESSION: 1. Cholelithiasis with gallbladder wall thickening and fat stranding about the gallbladder and mild periportal edema. Findings are consistent with acute cholecystitis. 2. Probable calculi and/or sludge in the gallbladder neck. 3. No biliary ductal dilatation or common bile duct calculi identified. 4. Hepatic steatosis. Electronically Signed   By: Lauralyn Primes M.D.   On: 06/11/2021 14:02   MR ABDOMEN MRCP W WO CONTAST  Result Date: 06/11/2021 CLINICAL DATA:  Right upper quadrant abdominal pain EXAM: MRI ABDOMEN WITHOUT AND WITH CONTRAST (INCLUDING MRCP) TECHNIQUE: Multiplanar multisequence MR imaging of the abdomen was performed both before and after the administration of intravenous contrast. Heavily T2-weighted images of the biliary and pancreatic ducts were obtained, and three-dimensional MRCP images were rendered by post processing. CONTRAST:  49mL GADAVIST GADOBUTROL 1 MMOL/ML IV SOLN COMPARISON:  CT abdomen pelvis, 06/06/2021 FINDINGS: Lower chest: No acute findings. Hepatobiliary: Hepatic steatosis. No mass or other parenchymal abnormality identified. Mild periportal edema. Wall thickening and fat stranding about the gallbladder (series 8, image 20). Numerous small calculi in the dependent gallbladder. No biliary ductal dilatation. Probable calculi and/or sludge in the gallbladder neck (series 16, image 11). Pancreas: No mass, inflammatory changes, or other parenchymal abnormality identified. Spleen:  Within normal limits in size and appearance. Adrenals/Urinary Tract: No masses identified. No evidence of hydronephrosis. Stomach/Bowel: Visualized portions within the abdomen are unremarkable. Vascular/Lymphatic: No pathologically enlarged lymph nodes identified. No abdominal aortic aneurysm demonstrated. Other:  None. Musculoskeletal: No suspicious bone lesions identified. IMPRESSION: 1. Cholelithiasis with  gallbladder wall thickening and fat stranding about the gallbladder and mild periportal edema. Findings are consistent with acute cholecystitis. 2. Probable calculi and/or sludge in the gallbladder neck. 3. No biliary ductal dilatation or common bile duct calculi identified. 4. Hepatic steatosis. Electronically Signed   By: Lauralyn Primes M.D.   On: 06/11/2021 14:02    Anti-infectives: Anti-infectives (From admission, onward)    Start     Dose/Rate Route Frequency Ordered Stop   06/11/21 0600  piperacillin-tazobactam (ZOSYN) IVPB 3.375 g  3.375 g 12.5 mL/hr over 240 Minutes Intravenous Every 8 hours 06/11/21 0018     06/10/21 2330  piperacillin-tazobactam (ZOSYN) IVPB 3.375 g        3.375 g 100 mL/hr over 30 Minutes Intravenous  Once 06/10/21 2323 06/11/21 0029       Assessment/Plan: Problem List: Patient Active Problem List   Diagnosis Date Noted   Right upper quadrant pain 06/11/2021   Crohn's disease of both small and large intestine without complications (HCC) 12/31/2019   Other rosacea 05/01/2017    Will likely need an ERCP for yesterday's nonemptying cholangiogram and todays' labs.   1 Day Post-Op    LOS: 1 day   Matt B. Daphine DeutscherMartin, MD, [facility]ACS  [facility] [phone] to reach the surgeon on call.    06/13/2021 9:12 AM

## 2021-06-13 NOTE — Progress Notes (Signed)
Referring Provider: Dr. Johnathan Hausen  Primary Care Physician:  Tisovec, Fransico Him, MD Primary Gastroenterologist:  Dr. Earlean Shawl  RE Consult:  + IOC, requires ERCP  HPI: Jesse Hanson is a 35 y.o. male with a past medical history of Crohn's disease to the small bowel and colon on Azathioprine Stelara.  Past cholecystectomy, perirectal abscess I&D and hernia repair.    Refer to initial GI consultation by Azucena Freed, PA-C and Dr. Rush Landmark 06/11/2021  [place]. Evette Doffing was admitted to the hospital on 05/26/2021 upper abdominal pain and elevated LFTs.  Abdominal sonogram showed evidence of a dilated CBD with potential choledocholithiasis versus cholecystitis.  An abdominal MRI/MRCP showed evidence of cholelithiasis with gallbladder wall thickening consistent with acute cholecystitis without evidence of choledocholithiasis or biliary ductal dilatation.  An ERCP was not indicated and our GI service signed off.  He underwent a laparoscopic cholecystectomy for acute cholecystitis by Dr. Kieth Brightly on 06/12/2021.  IOC showed obstruction of the CBD and his total bilirubin levels are rising.  Laboratory studies today showed a total bilirubin level of 5.0 up from 3.6.  Alk phos 114.  AST 351.  ALT 379.  WBC 9.7.  Hemoglobin 12.1.  A GI consult was requested for ERCP.   He had mild nausea this morning and vomited light yellow clear emesis after he received a dose of morphine.  No upper or lower abdominal pain at this time.  No bowel movement since admission.  No gas per the rectum for the past 24 hours.  His last dose of Stelara was approximately 3 weeks ago.  No chest pain or shortness of breath.  His mother is at the bedside.   Past Medical History:  Diagnosis Date   Crohn disease ([place])    Other rosacea 05/01/2017    Past Surgical History:  Procedure Laterality Date   CHOLECYSTECTOMY N/A 06/12/2021   Procedure: LAPAROSCOPIC CHOLECYSTECTOMY WITH INTRAOPERATIVE CHOLANGIOGRAM;  Surgeon: Kinsinger, Arta Bruce, MD;  Location: [facility];  Service: General;  Laterality: N/A;   HERNIA REPAIR Left    INCISION AND DRAINAGE PERIRECTAL ABSCESS     INCISION AND DRAINAGE PERIRECTAL ABSCESS      Prior to Admission medications   Medication Sig Start Date End Date Taking? Authorizing Provider  azaTHIOprine (IMURAN) 50 MG tablet Take 100 mg by mouth daily. 10/29/19 12/21/21 Yes [provider]  Cholecalciferol 25 MCG (1000 UT) tablet Take 1 tablet by mouth daily. 06/19/19 06/19/21 Yes [provider]  ferrous sulfate 325 (65 FE) MG tablet Take 325 mg by mouth daily.   Yes [provider]  STELARA 90 MG/ML SOSY injection Inject 1 mL into the skin every 8 (eight) weeks. 05/13/21  Yes [provider]    Current Facility-Administered Medications  Medication Dose Route Frequency Provider Last Rate Last Admin   acetaminophen (TYLENOL) suppository 650 mg  650 mg Rectal Q6H PRN Maczis, Barth Kirks, PA-C       acetaminophen (TYLENOL) tablet 1,000 mg  1,000 mg Oral Q6H PRN Maczis, Barth Kirks, PA-C       azaTHIOprine [facility]) tablet 100 mg  100 mg Oral Daily Jillyn Ledger, PA-C   100 mg at 06/13/21 3007   lactated ringers infusion   Intravenous Continuous Jillyn Ledger, PA-C 100 mL/hr at 06/13/21 0527 New Bag at 06/13/21 0527   lisinopril (ZESTRIL) tablet 10 mg  10 mg Oral Daily Cristal Deer, MD   10 mg at 06/13/21 0911   methocarbamol (ROBAXIN) tablet 500 mg  500 mg Oral Q6H PRN Jillyn Ledger, PA-C   500 mg at 06/12/21 2106   morphine 2 MG/ML injection 2 mg  2 mg Intravenous Q2H PRN Jillyn Ledger, PA-C   2 mg at 06/13/21 0610   ondansetron (ZOFRAN) injection 4 mg  4 mg Intravenous Q6H PRN Jillyn Ledger, PA-C   4 mg at 06/13/21 3557   oxyCODONE (Oxy IR/ROXICODONE) immediate release tablet 5-10 mg  5-10 mg Oral Q4H PRN Jillyn Ledger, PA-C   10 mg at 06/13/21 0301   piperacillin-tazobactam (ZOSYN) IVPB 3.375 g  3.375 g Intravenous Q8H Jillyn Ledger, PA-C 12.5  mL/hr at 06/13/21 0527 3.375 g at 06/13/21 0527    Allergies as of 06/10/2021   (Not on File)    Family History  Problem Relation Age of Onset   Heart disease Neg Hx     Social History   Socioeconomic History   Marital status: Single    Spouse name: Not on file   Number of children: Not on file   Years of education: Not on file   Highest education level: Not on file  Occupational History   Not on file  Tobacco Use   Smoking status: Never   Smokeless tobacco: Never  Vaping Use   Vaping Use: Never used  Substance and Sexual Activity   Alcohol use: Yes    Alcohol/week: 70.0 standard drinks    Types: 70 Standard drinks or equivalent per week   Drug use: Never   Sexual activity: Not Currently  Other Topics Concern   Not on file  Social History Narrative   Not on file   Social Determinants of Health   Financial Resource Strain: Not on file  Food Insecurity: Not on file  Transportation Needs: Not on file  Physical Activity: Not on file  Stress: Not on file  Social Connections: Not on file  Intimate Partner Violence: Not on file    Review of Systems: See HPI, all other systems reviewed and are negative.  Physical Exam: Vital signs in last 24 hours: Temp:  [97.8 F (36.6 C)-99 F (37.2 C)] 98 F (36.7 C) (06/19 0908) Pulse Rate:  [65-88] 81 (06/19 0908) Resp:  [16-18] 17 (06/19 0908) BP: (114-155)/(81-99) 150/88 (06/19 0908) SpO2:  [91 %-99 %] 99 % (06/19 0908)   General:  Alert 35 year old male with significant facial rosacea. Head:  Normocephalic and atraumatic. Eyes: Moderate scleral icterus.  Conjunctiva pink. Ears:  Normal auditory acuity. Nose:  No deformity, discharge or lesions. Mouth:  Dentition intact. No ulcers or lesions.  Neck:  Supple. No lymphadenopathy or thyromegaly.  Lungs: Breath sounds clear throughout. Heart: Rate and rhythm, no murmurs. Abdomen: Distended, soft.  Nontender.  Few bowel sounds auscultated.  Right JP drain with a small  amount of bloody drainage.  Surgical dressing dry and intact. Rectal: Deferred. Musculoskeletal:  Symmetrical without gross deformities.  Pulses:  Normal pulses noted. Extremities:  Without clubbing or edema. Neurologic:  Alert and  oriented x4. No focal deficits.  Skin:  Intact without significant lesions or rashes. Psych:  Alert and cooperative. Normal mood and affect.  Intake/Output from previous day: 06/18 0701 - 06/19 0700 In: 2300 [P.O.:300; I.V.:1800; IV Piggyback:200] Out: 95 [Drains:45; Blood:50] Intake/Output this shift: Total I/O In: 240 [P.O.:240] Out: -   Lab Results: Recent Labs    06/11/21 0711 06/12/21 0605 06/13/21 0214  WBC 9.7 8.1 9.7  HGB 12.9* 12.1* 12.1*  HCT 37.7* 35.8* 35.8*  PLT 322  350 376   BMET Recent Labs    06/11/21 0711 06/12/21 0605 06/13/21 0214  NA 138 139 137  K 3.7 3.6 3.2*  CL 104 106 99  CO2 26 26 27   GLUCOSE 102* 82 106*  BUN 5* 6 <5*  CREATININE 0.80 0.90 0.93  CALCIUM 8.6* 8.5* 8.6*   LFT Recent Labs    06/12/21 0605 06/13/21 0214  PROT 6.2* 6.1*  ALBUMIN 2.5* 2.5*  AST 239* 351*  ALT 283* 379*  ALKPHOS 99 114  BILITOT 3.6* 5.0*  BILIDIR 2.3*  --   IBILI 1.3*  --    PT/INR Recent Labs    06/11/21 0711  LABPROT 14.3  INR 1.1   Hepatitis Panel No results for input(s): HEPBSAG, HCVAB, HEPAIGM, HEPBIGM in the last 72 hours.    Studies/Results: DG Cholangiogram Operative  Result Date: 06/12/2021 CLINICAL DATA:  Cholelithiasis. EXAM: INTRAOPERATIVE CHOLANGIOGRAM TECHNIQUE: Cholangiographic images from the C-arm fluoroscopic device were submitted for interpretation post-operatively. Please see the procedural report for the amount of contrast and the fluoroscopy time utilized. COMPARISON:  MRI 06/11/2021 FINDINGS: Cannulation and opacification of the cystic duct. Contrast fills the cystic duct, common hepatic duct and common bile duct. There is obstruction in the distal common bile duct. Small amount of contrast  extravasation at the cystic duct cannulation site. No large filling defects within the opacified extrahepatic biliary system. Contrast does not drain into the duodenum. IMPRESSION: Obstruction of the distal common bile duct. Contrast never drains into the duodenum. Uncertain etiology for the distal common bile duct obstruction. Small amount of contrast extravasation at the cannulation site. Electronically Signed   By: Markus Daft M.D.   On: 06/12/2021 11:42   NM Hepatobiliary Liver Func  Result Date: 06/11/2021 CLINICAL DATA:  Right upper quadrant abdominal pain and nausea. EXAM: NUCLEAR MEDICINE HEPATOBILIARY IMAGING TECHNIQUE: Sequential images of the abdomen were obtained out to 240 minutes following intravenous administration of radiopharmaceutical. RADIOPHARMACEUTICALS:  3.1 mCi Tc-68m Choletec IV COMPARISON:  Abdomen MRI/MRCP on 06/11/2021 FINDINGS: Prompt radiopharmaceutical uptake is seen within the hepatic parenchyma. There is no biliary excretion of activity by the liver at imaging performed out to 4 hours, consistent with common bile duct obstruction. Therefore, cystic duct patency cannot be assessed on this exam, however there are findings of acute cholecystitis on the MRI performed earlier today. IMPRESSION: Findings demonstrate obstruction of the common bile duct. Therefore, cystic duct patency cannot be assessed on this exam; however the MRI performed earlier today shows findings of acute cholecystitis. These results were called by telephone at the time of interpretation on 06/11/2021 at 7:29 pm to [facility], who verbally acknowledged these results. Electronically Signed   By: JMarlaine HindM.D.   On: 06/11/2021 19:31   MR 3D Recon At Scanner  Result Date: 06/11/2021 CLINICAL DATA:  Right upper quadrant abdominal pain EXAM: MRI ABDOMEN WITHOUT AND WITH CONTRAST (INCLUDING MRCP) TECHNIQUE: Multiplanar multisequence MR imaging of the abdomen was performed both before and after the  administration of intravenous contrast. Heavily T2-weighted images of the biliary and pancreatic ducts were obtained, and three-dimensional MRCP images were rendered by post processing. CONTRAST:  152mGADAVIST GADOBUTROL 1 MMOL/ML IV SOLN COMPARISON:  CT abdomen pelvis, 06/06/2021 FINDINGS: Lower chest: No acute findings. Hepatobiliary: Hepatic steatosis. No mass or other parenchymal abnormality identified. Mild periportal edema. Wall thickening and fat stranding about the gallbladder (series 8, image 20). Numerous small calculi in the dependent gallbladder. No biliary ductal dilatation. Probable calculi  and/or sludge in the gallbladder neck (series 16, image 11). Pancreas: No mass, inflammatory changes, or other parenchymal abnormality identified. Spleen:  Within normal limits in size and appearance. Adrenals/Urinary Tract: No masses identified. No evidence of hydronephrosis. Stomach/Bowel: Visualized portions within the abdomen are unremarkable. Vascular/Lymphatic: No pathologically enlarged lymph nodes identified. No abdominal aortic aneurysm demonstrated. Other:  None. Musculoskeletal: No suspicious bone lesions identified. IMPRESSION: 1. Cholelithiasis with gallbladder wall thickening and fat stranding about the gallbladder and mild periportal edema. Findings are consistent with acute cholecystitis. 2. Probable calculi and/or sludge in the gallbladder neck. 3. No biliary ductal dilatation or common bile duct calculi identified. 4. Hepatic steatosis. Electronically Signed   By: Eddie Candle M.D.   On: 06/11/2021 14:02   MR ABDOMEN MRCP W WO CONTAST  Result Date: 06/11/2021 CLINICAL DATA:  Right upper quadrant abdominal pain EXAM: MRI ABDOMEN WITHOUT AND WITH CONTRAST (INCLUDING MRCP) TECHNIQUE: Multiplanar multisequence MR imaging of the abdomen was performed both before and after the administration of intravenous contrast. Heavily T2-weighted images of the biliary and pancreatic ducts were obtained, and  three-dimensional MRCP images were rendered by post processing. CONTRAST:  69m GADAVIST GADOBUTROL 1 MMOL/ML IV SOLN COMPARISON:  CT abdomen pelvis, 06/06/2021 FINDINGS: Lower chest: No acute findings. Hepatobiliary: Hepatic steatosis. No mass or other parenchymal abnormality identified. Mild periportal edema. Wall thickening and fat stranding about the gallbladder (series 8, image 20). Numerous small calculi in the dependent gallbladder. No biliary ductal dilatation. Probable calculi and/or sludge in the gallbladder neck (series 16, image 11). Pancreas: No mass, inflammatory changes, or other parenchymal abnormality identified. Spleen:  Within normal limits in size and appearance. Adrenals/Urinary Tract: No masses identified. No evidence of hydronephrosis. Stomach/Bowel: Visualized portions within the abdomen are unremarkable. Vascular/Lymphatic: No pathologically enlarged lymph nodes identified. No abdominal aortic aneurysm demonstrated. Other:  None. Musculoskeletal: No suspicious bone lesions identified. IMPRESSION: 1. Cholelithiasis with gallbladder wall thickening and fat stranding about the gallbladder and mild periportal edema. Findings are consistent with acute cholecystitis. 2. Probable calculi and/or sludge in the gallbladder neck. 3. No biliary ductal dilatation or common bile duct calculi identified. 4. Hepatic steatosis. Electronically Signed   By: AEddie CandleM.D.   On: 06/11/2021 14:02    IMPRESSION/PLAN:  [age]year-old male admitted to the hospital with upper abdominal pain and elevated LFTs.  Abdominal imaging showed gallstones with cholecystitis without evidence of choledocholithiasis.  S/P laparoscopic cholecystectomy 06/12/2021, IOC showed obstruction of the CBD and his total bilirubin levels are rising. -NPO -ERCP with Dr. MRush Landmarkthis afternoon.  ERCP benefits and risks discussed with the patient including risk with general anesthesia, bleeding, perforation, infection and  pancreatitis -Continue Zosyn IV -Zofran 4 mg IV every 6 hours as needed -Further recommendation to be determined after ERCP completed  2.  Small bowel and colon Crohn's disease on Azathioprine and Stelara followed by Dr. MEarlean Shawl-Continue follow-up with Dr. MEarlean Shawlas outpatient     CNoralyn Pick 06/13/2021, 11:17 AM

## 2021-06-13 NOTE — H&P (Signed)
GASTROENTEROLOGY PROCEDURE H&P NOTE   Primary Care Physician: Hanson, Jesse Koh, MD  HPI: Jesse Hanson is a 35 y.o. male who presents for ERCP for Positive IOC and concern for Choledocholithiasis and Abnormal LFTs.  Past Medical History:  Diagnosis Date   Crohn disease ([facility])    Other rosacea 05/01/2017   Past Surgical History:  Procedure Laterality Date   CHOLECYSTECTOMY N/A 06/12/2021   Procedure: LAPAROSCOPIC CHOLECYSTECTOMY WITH INTRAOPERATIVE CHOLANGIOGRAM;  Surgeon: Jesse Pickle, MD;  Location: [facility];  Service: General;  Laterality: N/A;   HERNIA REPAIR Left    INCISION AND DRAINAGE PERIRECTAL ABSCESS     INCISION AND DRAINAGE PERIRECTAL ABSCESS     Current Facility-Administered Medications  Medication Dose Route Frequency Provider Last Rate Last Admin   0.9 %  sodium chloride infusion   Intravenous Continuous Mansouraty, Netty Starring., MD       [MAR Hold] acetaminophen (TYLENOL) suppository 650 mg  650 mg Rectal Q6H PRN Jesse Halim, PA-C       [MAR Hold] acetaminophen (TYLENOL) tablet 1,000 mg  1,000 mg Oral Q6H PRN Jesse Halim, PA-C       [MAR Hold] [facility] tablet 100 mg  100 mg Oral Daily Jesse Halim, PA-C   100 mg at 06/13/21 1517   lactated ringers infusion   Intravenous Continuous Jesse Halim, PA-C 100 mL/hr at 06/13/21 0527 New Bag at 06/13/21 0527   [MAR Hold] lisinopril (ZESTRIL) tablet 10 mg  10 mg Oral Daily Jesse Neither, MD   10 mg at 06/13/21 0911   [MAR Hold] methocarbamol (ROBAXIN) tablet 500 mg  500 mg Oral Q6H PRN Jesse Halim, PA-C   500 mg at 06/12/21 2106   [facility] Hold] morphine 2 MG/ML injection 2 mg  2 mg Intravenous Q2H PRN Jesse Halim, PA-C   2 mg at 06/13/21 0610   [MAR Hold] ondansetron (ZOFRAN) injection 4 mg  4 mg Intravenous Q6H PRN Jesse Halim, PA-C   4 mg at 06/13/21 1219   [MAR Hold] oxyCODONE (Oxy IR/ROXICODONE) immediate release tablet 5-10 mg  5-10 mg Oral Q4H PRN Jesse Halim, PA-C   10 mg at 06/13/21 0301   [MAR Hold] piperacillin-tazobactam (ZOSYN) IVPB 3.375 g  3.375 g Intravenous Q8H Jesse Sow, PA-C 12.5 mL/hr at 06/13/21 0527 3.375 g at 06/13/21 6160   No Known Allergies Family History  Problem Relation Age of Onset   Heart disease Neg Hx    Social History   Socioeconomic History   Marital status: Single    Spouse name: Not on file   Number of children: Not on file   Years of education: Not on file   Highest education level: Not on file  Occupational History   Not on file  Tobacco Use   Smoking status: Never   Smokeless tobacco: Never  Vaping Use   Vaping Use: Never used  Substance and Sexual Activity   Alcohol use: Yes    Alcohol/week: 70.0 standard drinks    Types: 70 Standard drinks or equivalent per week   Drug use: Never   Sexual activity: Not Currently  Other Topics Concern   Not on file  Social History Narrative   Not on file   Social Determinants of Health   Financial Resource Strain: Not on file  Food Insecurity: Not on file  Transportation Needs: Not on file  Physical Activity: Not on file  Stress: Not on file  Social Connections:  Not on file  Intimate Partner Violence: Not on file    Physical Exam: Vital signs in last 24 hours: Temp:  [97.8 F (36.6 C)-99 F (37.2 C)] 98.8 F (37.1 C) (06/19 1236) Pulse Rate:  [65-88] 69 (06/19 1236) Resp:  [12-18] 12 (06/19 1236) BP: (144-155)/(88-99) 144/99 (06/19 1236) SpO2:  [96 %-99 %] 99 % (06/19 1236)   GEN: NAD EYE: Sclerae anicteric ENT: MMM CV: Non-tachycardic GI: Soft, protuberant abdomen, TTP 2-3/10, RUQ drain in place with serosanguinous and bilious drainage NEURO:  Alert & Oriented x 3  Lab Results: Recent Labs    06/11/21 0711 06/12/21 0605 06/13/21 0214  WBC 9.7 8.1 9.7  HGB 12.9* 12.1* 12.1*  HCT 37.7* 35.8* 35.8*  PLT 322 350 376   BMET Recent Labs    06/11/21 0711 06/12/21 0605 06/13/21 0214  NA 138 139 137  K 3.7 3.6 3.2*   CL 104 106 99  CO2 26 26 27   GLUCOSE 102* 82 106*  BUN 5* 6 <5*  CREATININE 0.80 0.90 0.93  CALCIUM 8.6* 8.5* 8.6*   LFT Recent Labs    06/12/21 0605 06/13/21 0214  PROT 6.2* 6.1*  ALBUMIN 2.5* 2.5*  AST 239* 351*  ALT 283* 379*  ALKPHOS 99 114  BILITOT 3.6* 5.0*  BILIDIR 2.3*  --   IBILI 1.3*  --    PT/INR Recent Labs    06/11/21 0711  LABPROT 14.3  INR 1.1     Impression / Plan: This is a 35 y.o.malewho presents for ERCP for Positive IOC and concern for Choledocholithiasis and Abnormal LFTs.  The risks of an ERCP were discussed at length, including but not limited to the risk of perforation, bleeding, abdominal pain, post-ERCP pancreatitis (while usually mild can be severe and even life threatening).   The risks and benefits of endoscopic evaluation were discussed with the patient; these include but are not limited to the risk of perforation, infection, bleeding, missed lesions, lack of diagnosis, severe illness requiring hospitalization, as well as anesthesia and sedation related illnesses.  The patient is agreeable to proceed.    34, MD [facility] # [place]

## 2021-06-13 NOTE — Op Note (Signed)
[facility] Patient Name: Jesse Hanson Procedure Date : 06/13/2021 MRN: [id number] Attending MD: Justice Britain , MD Date of Birth: March 13, 1986 CSN: [id number] Age: 35 Admit Type: Inpatient Procedure:                ERCP Indications:              Abnormal intraoperative cholangiogram, Jaundice,                            Abnormal liver function test Providers:                Justice Britain, MD, Elmer Ramp. Tilden Dome, RN, Janie                            Billups, Technician,Kelly Tomkins CRNA Referring MD:             Arta Bruce Kinsinger MD, MD, Dr. Earlean Shawl, Hospital                            Team Medicines:                General Anesthesia, Zosyn 3.375 g, Indomethacin 100                            mg PR, Glucagon 2.83 mg IV Complications:            No immediate complications. Estimated Blood Loss:     Estimated blood loss was minimal. Procedure:                Pre-Anesthesia Assessment:                           - Prior to the procedure, a History and Physical                            was performed, and patient medications and                            allergies were reviewed. The patient's tolerance of                            previous anesthesia was also reviewed. The risks                            and benefits of the procedure and the sedation                            options and risks were discussed with the patient.                            All questions were answered, and informed consent                            was obtained. Prior Anticoagulants: The patient has  taken no previous anticoagulant or antiplatelet                            agents. ASA Grade Assessment: II - A patient with                            mild systemic disease. After reviewing the risks                            and benefits, the patient was deemed in                            satisfactory condition to undergo the procedure.                            After obtaining informed consent, the scope was                            passed under direct vision. Throughout the                            procedure, the patient's blood pressure, pulse, and                            oxygen saturations were monitored continuously. The                            TJF- Q180V ([id number]) Olympus duodenoscope was                            introduced through the mouth, and used to inject                            contrast into and used to inject contrast into the                            bile duct and ventral pancreatic duct. The ERCP was                            accomplished without difficulty. The patient                            tolerated the procedure. Scope In: Scope Out: Findings:      A scout film of the abdomen was obtained. Surgical clips, consistent       with previous cholecystectomy, were seen in the area of the right upper       quadrant of the abdomen. One percutaneous drain ending in the Right       upper quadrant was seen.      The upper GI tract was traversed under direct vision without detailed       examination. Segmental moderate inflammation characterized by erosions,       erythema and friability was found in the gastric body, at the incisura       and in  the gastric antrum. Biopsies were taken in the cardia, on the       greater curvature of the stomach, on the lesser curvature of the       stomach, at the incisura and in the gastric antrum through the ERCP       scope with the cold forceps for histology and HP evaluation. No gross       lesions were noted in the duodenal bulb, in the first portion of the       duodenum and in the second portion of the duodenum. Biopsies were taken       in the cardia, on the greater curvature of the stomach, on the lesser       curvature of the stomach, at the incisura and in the gastric antrum       through the ERCP scope with the cold forceps for histology. The major       papilla  had a large intraduodenal portion but was otherwise normal.      Repeated attempts at biliary cannulation on two occasions were not       successful while using a wire-guided approach. This led to placement of       the wire within the pancreatic duct. Decision was made to pursue a       double-wire approach. The wire was left within the pancreatic duct.      A second short 0.035 inch Soft Jagwire was passed into the biliary tree       after further bowing the sphincterotome within the ampullary orifice.       The Hydratome sphincterotome was passed over the guidewire and the bile       duct was then deeply cannulated. Contrast was injected. I personally       interpreted the bile duct images. Ductal flow of contrast was adequate.       Image quality was adequate. Contrast extended to the bifurcation.       Opacification of the entire biliary tree except for the gallbladder was       successful. The maximum diameter of the ducts was 8 mm. The lower third       of the main bile duct contained a filling defect thought to be sludge. A       10 mm biliary sphincterotomy was made with a monofilament Hydratome       sphincterotome using ERBE electrocautery all the way to the duodenal       wall. There was no post-sphincterotomy bleeding. To discover objects,       the biliary tree was swept with a retrieval balloon starting at the       bifurcation. Small amounts of sludge were swept from the duct but no       stones were present. An occlusion cholangiogram was performed that       showed no further significant biliary pathology.      To decrease risk of Post-ERCP Pancreatitis, one 4 Fr by 5 cm temporary       plastic pancreatic stent with a single external pigtail was placed into       the ventral pancreatic duct. The stent was in good position. A       pancreatogram was not performed.      The duodenoscope was withdrawn from the patient. Impression:               - Moderate gastritis -  biopsied.Marland Kitchen                           -  No gross lesions in the duodenal bulb, in the                            first portion of the duodenum and in the second                            portion of the duodenum.                           - The major papilla appeared normal.                           - Double-wire technique required for biliary                            cannulation as noted above.                           - The fluoroscopic examination was suspicious for                            sludge.                           - A biliary sphincterotomy was performed.                           - The biliary tree was swept and small amount of                            sludge was found but no large stones.                           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                           - One temporary plastic pancreatic stent was placed                            into the ventral pancreatic duct to decrease PEP. Recommendation:           - The patient will be observed post-procedure,                            until all discharge criteria are met.                           - Return patient to hospital ward for ongoing care.                           -  Observe patient's clinical course.                           - Watch for pancreatitis, bleeding, perforation,                            and cholangitis.                           - Follow up pathology.                           - Check liver enzymes (AST, ALT, alkaline                            phosphatase, bilirubin) in the morning.                           - Would hold chemical VTE prophylaxis for 48 hours                            to decrease risk of post-interventional bleeding.                            If anticoagulation is necessary consider heparin                            drip without  bolus in 6-12 hours and monitor                            closely.                           - Patient will need a KUB 2-view in 10-14 days to                            ensure pancreatic stent has migrated successfully.                            If still present at that time will need to be                            scheduled for EGD with stent pull.                           - Patient will follow up with his primary GI in                            regards to his underlying Crohn's disease - Dr.                            Earlean Shawl.                           - The findings and recommendations were discussed  with the patient.                           - The findings and recommendations were discussed                            with the patient's family.                           - The findings and recommendations were discussed                            with the referring physician. Procedure Code(s):        --- Professional ---                           [phone], Endoscopic retrograde                            cholangiopancreatography (ERCP); with placement of                            endoscopic stent into biliary or pancreatic duct,                            including pre- and post-dilation and guide wire                            passage, when performed, including sphincterotomy,                            when performed, each stent                           [id number], Endoscopic retrograde                            cholangiopancreatography (ERCP); with removal of                            calculi/debris from biliary/pancreatic duct(s) Diagnosis Code(s):        --- Professional ---                           K29.70, Gastritis, unspecified, without bleeding                           R17, Unspecified jaundice                           R94.5, Abnormal results of liver function studies                           R93.2, Abnormal findings on diagnostic imaging of                             liver and biliary tract CPT copyright 2019  American Medical Association. All rights reserved. The codes documented in this report are preliminary and upon coder review may  be revised to meet current compliance requirements. Justice Britain, MD 06/13/2021 2:27:52 PM Number of Addenda: 0

## 2021-06-13 NOTE — Progress Notes (Signed)
JP appears to be leaking, surgeon contacted, response given. Anticipating ERCP today, NPO order acknowledged and patient informed. Family member at bedside and also informed of procedure/NPO.

## 2021-06-13 NOTE — Anesthesia Procedure Notes (Signed)
Procedure Name: Intubation Date/Time: 06/13/2021 1:27 PM Performed by: Adria Dill, CRNA Pre-anesthesia Checklist: Patient identified, Emergency Drugs available, Suction available and Patient being monitored Patient Re-evaluated:Patient Re-evaluated prior to induction Oxygen Delivery Method: Circle system utilized Preoxygenation: Pre-oxygenation with 100% oxygen Induction Type: IV induction Ventilation: Mask ventilation without difficulty Laryngoscope Size: Miller and 3 Grade View: Grade I Tube type: Oral Tube size: 7.5 mm Number of attempts: 1 Airway Equipment and Method: Stylet and Oral airway Placement Confirmation: ETT inserted through vocal cords under direct vision, positive ETCO2 and breath sounds checked- equal and bilateral Secured at: 21 cm Tube secured with: Tape Dental Injury: Teeth and Oropharynx as per pre-operative assessment

## 2021-06-13 NOTE — Transfer of Care (Signed)
Immediate Anesthesia Transfer of Care Note  Patient: Jesse Hanson  Procedure(s) Performed: ENDOSCOPIC RETROGRADE CHOLANGIOPANCREATOGRAPHY (ERCP) SPHINCTEROTOMY BIOPSY PANCREATIC STENT PLACEMENT  Patient Location: PACU  Anesthesia Type:General  Level of Consciousness: drowsy and patient cooperative  Airway & Oxygen Therapy: Patient Spontanous Breathing and Patient connected to face mask oxygen  Post-op Assessment: Report given to RN and Post -op Vital signs reviewed and stable  Post vital signs: Reviewed and stable  Last Vitals:  Vitals Value Taken Time  BP 119/84 06/13/21 1417  Temp    Pulse 78 06/13/21 1418  Resp 18 06/13/21 1418  SpO2 100 % 06/13/21 1418  Vitals shown include unvalidated device data.  Last Pain:  Vitals:   06/13/21 1236  TempSrc: Oral  PainSc: 0-No pain      Patients Stated Pain Goal: 2 (06/13/21 0610)  Complications: No notable events documented.

## 2021-06-14 ENCOUNTER — Encounter (HOSPITAL_COMMUNITY): Payer: Self-pay | Admitting: Gastroenterology

## 2021-06-14 DIAGNOSIS — K8001 Calculus of gallbladder with acute cholecystitis with obstruction: Secondary | ICD-10-CM | POA: Diagnosis present

## 2021-06-14 DIAGNOSIS — I10 Essential (primary) hypertension: Secondary | ICD-10-CM | POA: Diagnosis not present

## 2021-06-14 DIAGNOSIS — K810 Acute cholecystitis: Secondary | ICD-10-CM | POA: Diagnosis present

## 2021-06-14 DIAGNOSIS — R1011 Right upper quadrant pain: Secondary | ICD-10-CM | POA: Diagnosis not present

## 2021-06-14 LAB — COMPREHENSIVE METABOLIC PANEL
ALT: 306 U/L — ABNORMAL HIGH (ref 0–44)
AST: 110 U/L — ABNORMAL HIGH (ref 15–41)
Albumin: 2.5 g/dL — ABNORMAL LOW (ref 3.5–5.0)
Alkaline Phosphatase: 104 U/L (ref 38–126)
Anion gap: 7 (ref 5–15)
BUN: <5 mg/dL — ABNORMAL LOW (ref 6–20)
CO2: 28 mmol/L (ref 22–32)
Calcium: 8.6 mg/dL — ABNORMAL LOW (ref 8.9–10.3)
Chloride: 104 mmol/L (ref 98–111)
Creatinine, Ser: 0.89 mg/dL (ref 0.61–1.24)
GFR, Estimated: >60 mL/min (ref >60)
Glucose, Bld: 86 mg/dL (ref 70–99)
Potassium: 3.3 mmol/L — ABNORMAL LOW (ref 3.5–5.1)
Sodium: 139 mmol/L (ref 135–145)
Total Bilirubin: 2.2 mg/dL — ABNORMAL HIGH (ref 0.3–1.2)
Total Protein: 6.3 g/dL — ABNORMAL LOW (ref 6.5–8.1)

## 2021-06-14 LAB — CBC
HCT: 35.7 % — ABNORMAL LOW (ref 39.0–52.0)
Hemoglobin: 11.9 g/dL — ABNORMAL LOW (ref 13.0–17.0)
MCH: 32.4 pg (ref 26.0–34.0)
MCHC: 33.3 g/dL (ref 30.0–36.0)
MCV: 97.3 fL (ref 80.0–100.0)
Platelets: 373 10*3/uL (ref 150–400)
RBC: 3.67 MIL/uL — ABNORMAL LOW (ref 4.22–5.81)
RDW: 13 % (ref 11.5–15.5)
WBC: 8 10*3/uL (ref 4.0–10.5)
nRBC: 0 % (ref 0.0–0.2)

## 2021-06-14 MED ORDER — POTASSIUM CHLORIDE CRYS ER 20 MEQ PO TBCR
40.0000 meq | EXTENDED_RELEASE_TABLET | Freq: Once | ORAL | Status: AC
  Administered 2021-06-14: 40 meq via ORAL
  Filled 2021-06-14: qty 2

## 2021-06-14 MED ORDER — POTASSIUM CHLORIDE CRYS ER 20 MEQ PO TBCR: 40.0000 meq | EXTENDED_RELEASE_TABLET | Freq: Once | ORAL | Status: DC

## 2021-06-14 MED ORDER — OXYCODONE HCL 5 MG PO TABS: 5.0000 mg | ORAL_TABLET | Freq: Four times a day (QID) | ORAL | 0 refills | Status: AC | PRN

## 2021-06-14 MED ORDER — ONDANSETRON HCL 4 MG PO TABS: 4.0000 mg | ORAL_TABLET | Freq: Every day | ORAL | 1 refills | Status: AC | PRN

## 2021-06-14 MED ORDER — CEFDINIR 300 MG PO CAPS: 300.0000 mg | ORAL_CAPSULE | Freq: Two times a day (BID) | ORAL | 0 refills | Status: AC

## 2021-06-14 MED ORDER — LISINOPRIL 10 MG PO TABS: 10.0000 mg | ORAL_TABLET | Freq: Every day | ORAL | 0 refills | Status: AC

## 2021-06-14 NOTE — Progress Notes (Signed)
Patient ID: Jesse Hanson, male   DOB: 1986/11/05, 35 y.o.   MRN: [id number]    Progress Note   Subjective   Day # 4  CC; abnormal IOC, elevated LFTs-postop day #2 status post lap chole with drain placement  ERCP yesterday-moderate gastritis, sphincterotomy was done biliary tree swept and sludge found but no large stones, temporary plastic pancreatic duct stent placed  Labs today potassium 3.3 T bili 2.2/alk phos 104/AST 110/ALT 306 improved WBC 8.0/hemoglobin 11.9  JP drain in place  Patient feeling well and eating solid food, has had a little bit of pain from the JP but no change in symptoms otherwise since procedure yesterday and is hoping to go home today   Objective   Vital signs in last 24 hours: Temp:  [97.7 F (36.5 C)-98 F (36.7 C)] 97.8 F (36.6 C) (06/20 0540) Pulse Rate:  [54-87] 54 (06/20 0540) Resp:  [18-23] 18 (06/20 0540) BP: (119-128)/(84-100) 128/93 (06/20 0540) SpO2:  [94 %-100 %] 98 % (06/20 0540) Last BM Date: 06/13/21 General:    white male in NAD Heart:  Regular rate and rhythm; no murmurs Lungs: Respirations even and unlabored, lungs CTA bilaterally Abdomen:  Soft tender right upper quadrant, JP in place Extremities:  Without edema. Neurologic:  Alert and oriented,  grossly normal neurologically. Psych:  Cooperative. Normal mood and affect.  Intake/Output from previous day: 06/19 0701 - 06/20 0700 In: 3222.1 [P.O.:1080; I.V.:2058.3; IV Piggyback:83.8] Out: 100 [Drains:100] Intake/Output this shift: Total I/O In: 240 [P.O.:240] Out: 20 [Drains:20]  Lab Results: Recent Labs    06/12/21 0605 06/13/21 0214 06/14/21 1013  WBC 8.1 9.7 8.0  HGB 12.1* 12.1* 11.9*  HCT 35.8* 35.8* 35.7*  PLT 350 376 373   BMET Recent Labs    06/12/21 0605 06/13/21 0214 06/14/21 1013  NA 139 137 139  K 3.6 3.2* 3.3*  CL 106 99 104  CO2 26 27 28   GLUCOSE 82 106* 86  BUN 6 <5* <5*  CREATININE 0.90 0.93 0.89  CALCIUM 8.5* 8.6* 8.6*   LFT Recent  Labs    06/12/21 0605 06/13/21 0214 06/14/21 1013  PROT 6.2*   < > 6.3*  ALBUMIN 2.5*   < > 2.5*  AST 239*   < > 110*  ALT 283*   < > 306*  ALKPHOS 99   < > 104  BILITOT 3.6*   < > 2.2*  BILIDIR 2.3*  --   --   IBILI 1.3*  --   --    < > = values in this interval not displayed.   PT/INR No results for input(s): LABPROT, INR in the last 72 hours.  Studies/Results: DG ERCP  Result Date: 06/14/2021 CLINICAL DATA:  35 year old male with history of cholelithiasis status post recent laparoscopic cholecystectomy with intraoperative cholangiogram demonstrating obstruction of the distal common bile duct. EXAM: ERCP TECHNIQUE: Multiple spot images obtained with the fluoroscopic device and submitted for interpretation post-procedure. FLUOROSCOPY TIME:  Fluoroscopy Time:  1 minutes, 42 seconds Radiation Exposure Index (if provided by the fluoroscopic device): 40.5 mGy Number of Acquired Spot Images: 8 COMPARISON:  06/12/2021 FINDINGS: Endoscope is positioned with the tip in the second portion of the duodenum. There is retrograde cannulation of the pancreatic and common bile ducts. Limited cholangiogram demonstrates mild dilation of the common bile duct. A plastic pancreatic duct stent is placed. Fluoroscopic loop demonstrates balloon sweep of the stool common bile duct. IMPRESSION: Limited retrograde cholangiogram. Images demonstrate balloon sweep of the distal  common bile duct and plastic pancreatic duct stent placement. These images were submitted for radiologic interpretation only. Please see the procedural report for the amount of contrast and the fluoroscopy time utilized. Jesse Cancer, [facility] Radiology Electronically Signed   By: Jesse Hanson   On: 06/14/2021 07:55       Assessment / Plan:    #24 35 year old white male status post lap chole for symptomatic cholelithiasis on 06/12/2021 with abnormal IOC and elevated LFTs  He is status  post ERCP, sphincterotomy and balloon sweeping of CBD sludge but no stones yesterday.  Temporary pancreatic duct stent placed  Patient is stable postprocedure, tolerating solid food LFTs improving CBC stable  #2 Crohn's disease-followed by Jesse Hanson  Plan; patient is stable from GI perspective for discharge home today I will have our office arrange for him to have a KUB done at our office on 06/25/2021 to ensure the pancreatic duct stent has fallen out.    Principal Problem:   Right upper quadrant pain Active Problems:   Crohn's disease of both small and large intestine without complications ([place])     LOS: 2 days   Jesse Hanson  06/14/2021, 1:07 PM

## 2021-06-14 NOTE — Progress Notes (Signed)
Progress Note  1 Day Post-Op  Subjective: CC: some mild post operative abdominal pain. Tolerating full liquid diet without nausea, emesis, or increased abdominal pain. Ambulating. He denies respiratory complaints  JP drain output 100 mL  Objective: Vital signs in last 24 hours: Temp:  [97.7 F (36.5 C)-98.8 F (37.1 C)] 97.8 F (36.6 C) (06/20 0540) Pulse Rate:  [54-87] 54 (06/20 0540) Resp:  [12-23] 18 (06/20 0540) BP: (119-144)/(84-100) 128/93 (06/20 0540) SpO2:  [94 %-100 %] 98 % (06/20 0540) Last BM Date: 06/13/21  Intake/Output from previous day: 06/19 0701 - 06/20 0700 In: 3222.1 [P.O.:1080; I.V.:2058.3; IV Piggyback:83.8] Out: 100 [Drains:100] Intake/Output this shift: Total I/O In: 240 [P.O.:240] Out: -   PE: General: pleasant, WD, male who is laying in bed in NAD HEENT: head is normocephalic, atraumatic.  Mouth is pink and moist Heart: regular, rate, and rhythm.  Palpable radial pulses bilaterally Lungs: Respiratory effort nonlabored Abd: soft, ND, +BS, mild appropriate post operative tenderness. Incision sites with glue intact. Drain with scant serosanguinous output MS: all 4 extremities are symmetrical with no cyanosis, clubbing, or edema. Skin: warm and dry with no masses, lesions, or rashes Psych: A&Ox3 with an appropriate affect.    Lab Results:  Recent Labs    06/12/21 0605 06/13/21 0214  WBC 8.1 9.7  HGB 12.1* 12.1*  HCT 35.8* 35.8*  PLT 350 376   BMET Recent Labs    06/12/21 0605 06/13/21 0214  NA 139 137  K 3.6 3.2*  CL 106 99  CO2 26 27  GLUCOSE 82 106*  BUN 6 <5*  CREATININE 0.90 0.93  CALCIUM 8.5* 8.6*   PT/INR No results for input(s): LABPROT, INR in the last 72 hours. CMP     Component Value Date/Time   NA 137 06/13/2021 0214   K 3.2 (L) 06/13/2021 0214   CL 99 06/13/2021 0214   CO2 27 06/13/2021 0214   GLUCOSE 106 (H) 06/13/2021 0214   BUN <5 (L) 06/13/2021 0214   CREATININE 0.93 06/13/2021 0214   CALCIUM 8.6 (L)  06/13/2021 0214   PROT 6.1 (L) 06/13/2021 0214   ALBUMIN 2.5 (L) 06/13/2021 0214   AST 351 (H) 06/13/2021 0214   ALT 379 (H) 06/13/2021 0214   ALKPHOS 114 06/13/2021 0214   BILITOT 5.0 (H) 06/13/2021 0214   GFRNONAA >60 06/13/2021 0214   Lipase     Component Value Date/Time   LIPASE 11 06/06/2021 2228       Studies/Results: DG Cholangiogram Operative  Result Date: 06/12/2021 CLINICAL DATA:  Cholelithiasis. EXAM: INTRAOPERATIVE CHOLANGIOGRAM TECHNIQUE: Cholangiographic images from the C-arm fluoroscopic device were submitted for interpretation post-operatively. Please see the procedural report for the amount of contrast and the fluoroscopy time utilized. COMPARISON:  MRI 06/11/2021 FINDINGS: Cannulation and opacification of the cystic duct. Contrast fills the cystic duct, common hepatic duct and common bile duct. There is obstruction in the distal common bile duct. Small amount of contrast extravasation at the cystic duct cannulation site. No large filling defects within the opacified extrahepatic biliary system. Contrast does not drain into the duodenum. IMPRESSION: Obstruction of the distal common bile duct. Contrast never drains into the duodenum. Uncertain etiology for the distal common bile duct obstruction. Small amount of contrast extravasation at the cannulation site. Electronically Signed   By: Richarda Overlie M.D.   On: 06/12/2021 11:42   DG ERCP  Result Date: 06/14/2021 CLINICAL DATA:  35 year old male with history of cholelithiasis status post recent laparoscopic cholecystectomy with intraoperative  cholangiogram demonstrating obstruction of the distal common bile duct. EXAM: ERCP TECHNIQUE: Multiple spot images obtained with the fluoroscopic device and submitted for interpretation post-procedure. FLUOROSCOPY TIME:  Fluoroscopy Time:  1 minutes, 42 seconds Radiation Exposure Index (if provided by the fluoroscopic device): 40.5 mGy Number of Acquired Spot Images: 8 COMPARISON:   06/12/2021 FINDINGS: Endoscope is positioned with the tip in the second portion of the duodenum. There is retrograde cannulation of the pancreatic and common bile ducts. Limited cholangiogram demonstrates mild dilation of the common bile duct. A plastic pancreatic duct stent is placed. Fluoroscopic loop demonstrates balloon sweep of the stool common bile duct. IMPRESSION: Limited retrograde cholangiogram. Images demonstrate balloon sweep of the distal common bile duct and plastic pancreatic duct stent placement. These images were submitted for radiologic interpretation only. Please see the procedural report for the amount of contrast and the fluoroscopy time utilized. Marliss Coots, MD [facility] Radiology Electronically Signed   By: Marliss Coots MD   On: 06/14/2021 07:55    Anti-infectives: Anti-infectives (From admission, onward)    Start     Dose/Rate Route Frequency Ordered Stop   06/11/21 0600  piperacillin-tazobactam (ZOSYN) IVPB 3.375 g        3.375 g 12.5 mL/hr over 240 Minutes Intravenous Every 8 hours 06/11/21 0018     06/10/21 2330  piperacillin-tazobactam (ZOSYN) IVPB 3.375 g        3.375 g 100 mL/hr over 30 Minutes Intravenous  Once 06/10/21 2323 06/11/21 0029        Assessment/Plan  Symptomatic cholelithiasis ?Choledocholithiasis - POD2 lap chole with drain placement - LK 6/18 - positive intraoperative IOC s/p ERCP 6/19 - tolerating FLD - pain controlled   FEN: soft ID: zosyn 6/16>> VTE: SCDs  Disposition: await CMP but stable for discharge from our perspective. Follow up scheduled and I have sent pain medication to his pharmacy   LOS: 2 days    Eric Form, [facility] Surgery 06/14/2021, 10:47 AM Please see Amion for pager number during day hours 7:00am-4:30pm

## 2021-06-14 NOTE — Discharge Summary (Signed)
Discharge Summary  Jesse Hanson LKG:[id number] DOB: [birth year]  PCP: Gaspar Garbe, MD  Admit date: 06/10/2021 Discharge date: 06/14/2021  Time spent: 30 minutes  Recommendations for Outpatient Follow-up:  Primary care provider General surgery Gastroenterology  Discharge Diagnoses:  Active Hospital Problems   Diagnosis Date Noted   Right upper quadrant pain 06/11/2021   Acute cholecystitis 06/14/2021   Cholelithiasis and acute cholecystitis with obstruction 06/14/2021   Essential hypertension 06/14/2021   Crohn's disease of both small and large intestine without complications (HCC) 12/31/2019   Other rosacea 05/01/2017    Resolved Hospital Problems  No resolved problems to display.    Discharge Condition: Improved  Diet recommendation: Regular  Vitals:   06/14/21 0540 06/14/21 1347  BP: (!) 128/93 (!) 157/108  Pulse: (!) 54 77  Resp: 18 17  Temp: 97.8 F (36.6 C) 98 F (36.7 C)  SpO2: 98% 97%    History of present illness:  35 year old with past medical history of Crohn's disease who presented to the hospital with abdominal pain and concern for hepatobiliary disease he was found to have choledocholithiasis and cholecystitis.   Hospital Course:  Principal Problem:   Right upper quadrant pain Active Problems:   Crohn's disease of both small and large intestine without complications (HCC)   Other rosacea   Acute cholecystitis   Cholelithiasis and acute cholecystitis with obstruction   Essential hypertension  1.  Cholecystitis status post laparoscopic cholecystectomy with intraoperative cholangiogram  2.  Hepatitis due to cholecystitis with possible gallstone.  He is HIDA scan did not reveal any stone this may just be due to gallbladder edema causing common bile duct cystic duct obstruction  3.  Obesity patient will benefit from weight loss  4.  Crohn's disease well controlled on current medication, on Azathioprine and Stelara followed by Dr.  Kinnie Scales -Continue follow-up with Dr. Kinnie Scales as outpatient   5.  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  Procedures: laparoscopic cholecystectomy with intraoperative cholangiogram MRCP ERCP 06/13/2021  Consultations: General surgery Gastroenterology  Discharge Exam: BP (!) 157/108 (BP Location: Right Arm)   Pulse 77   Temp 98 F (36.7 C) (Oral)   Resp 17   SpO2 97%  General: 35 y.o. year-old male well developed well nourished in no acute distress.  Alert and oriented x3. Cardiovascular: Regular rate and rhythm with no rubs or gallops.  No thyromegaly or JVD noted.   Respiratory: Clear to auscultation with no wheezes or rales. Good inspiratory effort. Abdomen: Soft mild tender nondistended with normal bowel sounds x4 quadrants. Musculoskeletal: No lower extremity edema. 2/4 pulses in all 4 extremities. Skin: No ulcerative lesions noted.  Erythematous rashes noted on face, Psychiatry: Mood is appropriate for condition and setting  Discharge Instructions You were cared for by a hospitalist during your hospital stay. If you have any questions about your discharge medications or the care you received while you were in the hospital after you are discharged, you can call the unit and asked to speak with the hospitalist on call if the hospitalist that took care of you is not available. Once you are discharged, your primary care physician will handle any further medical issues. Please note that NO REFILLS for any discharge medications will be authorized once you are discharged, as it is imperative that you return to your primary care physician (or establish a relationship with a primary care physician if you do not have one) for your aftercare needs so  that they can reassess your need for medications and monitor your lab values.  Discharge Instructions     Call MD for:  persistant nausea and vomiting   Complete by: As directed    Call MD for:  severe  uncontrolled pain   Complete by: As directed    Call MD for:  temperature >100.4   Complete by: As directed    Diet - low sodium heart healthy   Complete by: As directed    Discharge instructions   Complete by: As directed    Follow-up with primary care provider in 1 to 2 weeks Follow-up with GI as scheduled Follow-up with general surgery as scheduled Return to work clearance to be given by surgery at follow-up   Discharge wound care:   Complete by: As directed    Per surgery /in hospital dressing   Increase activity slowly   Complete by: As directed       Allergies as of 06/14/2021   No Known Allergies      Medication List     TAKE these medications    azaTHIOprine 50 MG tablet Commonly known as: IMURAN Take 100 mg by mouth daily.   cefdinir 300 MG capsule Commonly known as: OMNICEF Take 1 capsule (300 mg total) by mouth 2 (two) times daily for 5 days.   Cholecalciferol 25 MCG (1000 UT) tablet Take 1 tablet by mouth daily.   ferrous sulfate 325 (65 FE) MG tablet Take 325 mg by mouth daily.   lisinopril 10 MG tablet Commonly known as: ZESTRIL Take 1 tablet (10 mg total) by mouth daily. Start taking on: June 15, 2021   ondansetron 4 MG tablet Commonly known as: Zofran Take 1 tablet (4 mg total) by mouth daily as needed for nausea or vomiting.   oxyCODONE 5 MG immediate release tablet Commonly known as: Oxy IR/ROXICODONE Take 1 tablet (5 mg total) by mouth every 6 (six) hours as needed for up to 3 days for severe pain or moderate pain.   Stelara 90 MG/ML Sosy injection Generic drug: ustekinumab Inject 1 mL into the skin every 8 (eight) weeks.               Discharge Care Instructions  (From admission, onward)           Start     Ordered   06/14/21 0000  Discharge wound care:       Comments: Per surgery /in hospital dressing   06/14/21 1700           No Known Allergies  Follow-up Information     [facility] Follow  up.   Specialty: General Surgery Why: Follow up on 6/24 at 3:00pm for nurse visit to check your drain and possibly remove it. Please arrive 30 minutes early to complete paperwork and bring photo ID and insurance Contact information: [address] [place] [address] [phone]        [facility] [place] Follow up.   Specialty: General Surgery Why: follow up on 7/12 at 10:15 for postoperative follow up. Please arrive at 10 am to check in Contact information: [address] [place] [address] [phone]                 The results of significant diagnostics from this hospitalization (including imaging, microbiology, ancillary and laboratory) are listed below for reference.    Significant Diagnostic Studies: DG Cholangiogram Operative  Result Date:  06/12/2021 CLINICAL DATA:  Cholelithiasis. EXAM: INTRAOPERATIVE CHOLANGIOGRAM TECHNIQUE: Cholangiographic images from the C-arm fluoroscopic device were submitted for interpretation post-operatively. Please see the procedural report for the amount of contrast and the fluoroscopy time utilized. COMPARISON:  MRI 06/11/2021 FINDINGS: Cannulation and opacification of the cystic duct. Contrast fills the cystic duct, common hepatic duct and common bile duct. There is obstruction in the distal common bile duct. Small amount of contrast extravasation at the cystic duct cannulation site. No large filling defects within the opacified extrahepatic biliary system. Contrast does not drain into the duodenum. IMPRESSION: Obstruction of the distal common bile duct. Contrast never drains into the duodenum. Uncertain etiology for the distal common bile duct obstruction. Small amount of contrast extravasation at the cannulation site. Electronically Signed   By: Richarda Overlie M.D.   On: 06/12/2021 11:42   NM Hepatobiliary Liver Func  Result Date: 06/11/2021 CLINICAL DATA:  Right  upper quadrant abdominal pain and nausea. EXAM: NUCLEAR MEDICINE HEPATOBILIARY IMAGING TECHNIQUE: Sequential images of the abdomen were obtained out to 240 minutes following intravenous administration of radiopharmaceutical. RADIOPHARMACEUTICALS:  3.1 mCi Tc-43m  Choletec IV COMPARISON:  Abdomen MRI/MRCP on 06/11/2021 FINDINGS: Prompt radiopharmaceutical uptake is seen within the hepatic parenchyma. There is no biliary excretion of activity by the liver at imaging performed out to 4 hours, consistent with common bile duct obstruction. Therefore, cystic duct patency cannot be assessed on this exam, however there are findings of acute cholecystitis on the MRI performed earlier today. IMPRESSION: Findings demonstrate obstruction of the common bile duct. Therefore, cystic duct patency cannot be assessed on this exam; however the MRI performed earlier today shows findings of acute cholecystitis. These results were called by telephone at the time of interpretation on 06/11/2021 at 7:29 pm to provider [facility] , who verbally acknowledged these results. Electronically Signed   By: Danae Orleans M.D.   On: 06/11/2021 19:31   CT ABDOMEN PELVIS W CONTRAST  Result Date: 06/07/2021 CLINICAL DATA:  Acute abdominal pain and nausea. EXAM: CT ABDOMEN AND PELVIS WITH CONTRAST TECHNIQUE: Multidetector CT imaging of the abdomen and pelvis was performed using the standard protocol following bolus administration of intravenous contrast. CONTRAST:  OMNIPAQUE IOHEXOL 300 MG/ML  SOLN COMPARISON:  None. FINDINGS: Lower chest: Lung bases are clear. Hepatobiliary: Diffuse fatty infiltration of the liver. No focal lesions. Gallbladder and bile ducts are unremarkable. Pancreas: Unremarkable. No pancreatic ductal dilatation or surrounding inflammatory changes. Spleen: Normal in size without focal abnormality. Adrenals/Urinary Tract: Adrenal glands are unremarkable. Kidneys are normal, without renal calculi, focal lesion, or  hydronephrosis. Bladder is unremarkable. Stomach/Bowel: Stomach, small bowel, and colon are not abnormally distended. No wall thickening or inflammatory changes appreciated. Appendix is normal. Vascular/Lymphatic: No significant vascular findings are present. No enlarged abdominal or pelvic lymph nodes. Reproductive: Prostate is unremarkable. Other: No free air or free fluid in the abdomen. Small left inguinal hernia containing fat. Musculoskeletal: No acute or significant osseous findings. IMPRESSION: 1. No acute process demonstrated in the abdomen or pelvis. No evidence of bowel obstruction or inflammation. 2. Diffuse fatty infiltration of the liver. 3. Small left inguinal hernia containing fat. Electronically Signed   By: Burman Nieves M.D.   On: 06/07/2021 00:11   MR 3D Recon At Scanner  Result Date: 06/11/2021 CLINICAL DATA:  Right upper quadrant abdominal pain EXAM: MRI ABDOMEN WITHOUT AND WITH CONTRAST (INCLUDING MRCP) TECHNIQUE: Multiplanar multisequence MR imaging of the abdomen was performed both before and after the administration of intravenous contrast. Heavily  T2-weighted images of the biliary and pancreatic ducts were obtained, and three-dimensional MRCP images were rendered by post processing. CONTRAST:  10mL GADAVIST GADOBUTROL 1 MMOL/ML IV SOLN COMPARISON:  CT abdomen pelvis, 06/06/2021 FINDINGS: Lower chest: No acute findings. Hepatobiliary: Hepatic steatosis. No mass or other parenchymal abnormality identified. Mild periportal edema. Wall thickening and fat stranding about the gallbladder (series 8, image 20). Numerous small calculi in the dependent gallbladder. No biliary ductal dilatation. Probable calculi and/or sludge in the gallbladder neck (series 16, image 11). Pancreas: No mass, inflammatory changes, or other parenchymal abnormality identified. Spleen:  Within normal limits in size and appearance. Adrenals/Urinary Tract: No masses identified. No evidence of hydronephrosis.  Stomach/Bowel: Visualized portions within the abdomen are unremarkable. Vascular/Lymphatic: No pathologically enlarged lymph nodes identified. No abdominal aortic aneurysm demonstrated. Other:  None. Musculoskeletal: No suspicious bone lesions identified. IMPRESSION: 1. Cholelithiasis with gallbladder wall thickening and fat stranding about the gallbladder and mild periportal edema. Findings are consistent with acute cholecystitis. 2. Probable calculi and/or sludge in the gallbladder neck. 3. No biliary ductal dilatation or common bile duct calculi identified. 4. Hepatic steatosis. Electronically Signed   By: Lauralyn Primes M.D.   On: 06/11/2021 14:02   DG ERCP  Result Date: 06/14/2021 CLINICAL DATA:  35 year old male with history of cholelithiasis status post recent laparoscopic cholecystectomy with intraoperative cholangiogram demonstrating obstruction of the distal common bile duct. EXAM: ERCP TECHNIQUE: Multiple spot images obtained with the fluoroscopic device and submitted for interpretation post-procedure. FLUOROSCOPY TIME:  Fluoroscopy Time:  1 minutes, 42 seconds Radiation Exposure Index (if provided by the fluoroscopic device): 40.5 mGy Number of Acquired Spot Images: 8 COMPARISON:  06/12/2021 FINDINGS: Endoscope is positioned with the tip in the second portion of the duodenum. There is retrograde cannulation of the pancreatic and common bile ducts. Limited cholangiogram demonstrates mild dilation of the common bile duct. A plastic pancreatic duct stent is placed. Fluoroscopic loop demonstrates balloon sweep of the stool common bile duct. IMPRESSION: Limited retrograde cholangiogram. Images demonstrate balloon sweep of the distal common bile duct and plastic pancreatic duct stent placement. These images were submitted for radiologic interpretation only. Please see the procedural report for the amount of contrast and the fluoroscopy time utilized. Marliss Coots, MD Vascular and Interventional Radiology  [facility] Radiology Electronically Signed   By: Marliss Coots MD   On: 06/14/2021 07:55   MR ABDOMEN MRCP W WO CONTAST  Result Date: 06/11/2021 CLINICAL DATA:  Right upper quadrant abdominal pain EXAM: MRI ABDOMEN WITHOUT AND WITH CONTRAST (INCLUDING MRCP) TECHNIQUE: Multiplanar multisequence MR imaging of the abdomen was performed both before and after the administration of intravenous contrast. Heavily T2-weighted images of the biliary and pancreatic ducts were obtained, and three-dimensional MRCP images were rendered by post processing. CONTRAST:  10mL GADAVIST GADOBUTROL 1 MMOL/ML IV SOLN COMPARISON:  CT abdomen pelvis, 06/06/2021 FINDINGS: Lower chest: No acute findings. Hepatobiliary: Hepatic steatosis. No mass or other parenchymal abnormality identified. Mild periportal edema. Wall thickening and fat stranding about the gallbladder (series 8, image 20). Numerous small calculi in the dependent gallbladder. No biliary ductal dilatation. Probable calculi and/or sludge in the gallbladder neck (series 16, image 11). Pancreas: No mass, inflammatory changes, or other parenchymal abnormality identified. Spleen:  Within normal limits in size and appearance. Adrenals/Urinary Tract: No masses identified. No evidence of hydronephrosis. Stomach/Bowel: Visualized portions within the abdomen are unremarkable. Vascular/Lymphatic: No pathologically enlarged lymph nodes identified. No abdominal aortic aneurysm demonstrated. Other:  None. Musculoskeletal: No suspicious bone lesions identified.  IMPRESSION: 1. Cholelithiasis with gallbladder wall thickening and fat stranding about the gallbladder and mild periportal edema. Findings are consistent with acute cholecystitis. 2. Probable calculi and/or sludge in the gallbladder neck. 3. No biliary ductal dilatation or common bile duct calculi identified. 4. Hepatic steatosis. Electronically Signed   By: Lauralyn PrimesAlex  Bibbey M.D.   On: 06/11/2021 14:02     Microbiology: Recent Results (from the past 240 hour(s))  Resp Panel by RT-PCR (Flu A&B, Covid) Nasopharyngeal Swab     Status: None   Collection Time: 06/11/21 12:10 AM   Specimen: Nasopharyngeal Swab; Nasopharyngeal(NP) swabs in vial transport medium  Result Value Ref Range Status   SARS Coronavirus 2 by RT PCR NEGATIVE NEGATIVE Final    Comment: (NOTE) SARS-CoV-2 target nucleic acids are NOT DETECTED.  The SARS-CoV-2 RNA is generally detectable in upper respiratory specimens during the acute phase of infection. The lowest concentration of SARS-CoV-2 viral copies this assay can detect is 138 copies/mL. A negative result does not preclude SARS-Cov-2 infection and should not be used as the sole basis for treatment or other patient management decisions. A negative result may occur with  improper specimen collection/handling, submission of specimen other than nasopharyngeal swab, presence of viral mutation(s) within the areas targeted by this assay, and inadequate number of viral copies(<138 copies/mL). A negative result must be combined with clinical observations, patient history, and epidemiological information. The expected result is Negative.  Fact Sheet for Patients:  BloggerCourse.comhttps://www.fda.gov/media/152166/download  Fact Sheet for Healthcare Providers:  SeriousBroker.ithttps://www.fda.gov/media/152162/download  This test is no t yet approved or cleared by the [place] FDA and  has been authorized for detection and/or diagnosis of SARS-CoV-2 by FDA under an Emergency Use Authorization (EUA). This EUA will remain  in effect (meaning this test can be used) for the duration of the COVID-19 declaration under Section 564(b)(1) of the Act, 21 U.S.C.section 360bbb-3(b)(1), unless the authorization is terminated  or revoked sooner.       Influenza A by PCR NEGATIVE NEGATIVE Final   Influenza B by PCR NEGATIVE NEGATIVE Final    Comment: (NOTE) The Xpert Xpress SARS-CoV-2/FLU/RSV plus assay is  intended as an aid in the diagnosis of influenza from Nasopharyngeal swab specimens and should not be used as a sole basis for treatment. Nasal washings and aspirates are unacceptable for Xpert Xpress SARS-CoV-2/FLU/RSV testing.  Fact Sheet for Patients: BloggerCourse.comhttps://www.fda.gov/media/152166/download  Fact Sheet for Healthcare Providers: SeriousBroker.ithttps://www.fda.gov/media/152162/download  This test is not yet approved or cleared by the [place] FDA and has been authorized for detection and/or diagnosis of SARS-CoV-2 by FDA under an Emergency Use Authorization (EUA). This EUA will remain in effect (meaning this test can be used) for the duration of the COVID-19 declaration under Section 564(b)(1) of the Act, 21 U.S.C. section 360bbb-3(b)(1), unless the authorization is terminated or revoked.  Performed at [facility], [address], [place], [place] [address]      Labs: Basic Metabolic Panel: Recent Labs  Lab 06/10/21 2252 06/11/21 0711 06/12/21 0605 06/13/21 0214 06/14/21 1013  NA 138 138 139 137 139  K 3.6 3.7 3.6 3.2* 3.3*  CL 100 104 106 99 104  CO2 28 26 26 27 28   GLUCOSE 98 102* 82 106* 86  BUN 10 5* 6 <5* <5*  CREATININE 0.82 0.80 0.90 0.93 0.89  CALCIUM 9.9 8.6* 8.5* 8.6* 8.6*   Liver Function Tests: Recent Labs  Lab 06/10/21 2252 06/11/21 0711 06/12/21 0605 06/13/21 0214 06/14/21 1013  AST 39 271* 239* 351* 110*  ALT 29 160* 283* 379* 306*  ALKPHOS 81 110 99 114 104  BILITOT 1.3* 3.1* 3.6* 5.0* 2.2*  PROT 8.2* 6.4* 6.2* 6.1* 6.3*  ALBUMIN 4.2 2.7* 2.5* 2.5* 2.5*   No results for input(s): LIPASE, AMYLASE in the last 168 hours. No results for input(s): AMMONIA in the last 168 hours. CBC: Recent Labs  Lab 06/10/21 2252 06/11/21 0711 06/12/21 0605 06/13/21 0214 06/14/21 1013  WBC 13.5* 9.7 8.1 9.7 8.0  NEUTROABS  --  7.5  --   --   --   HGB 14.5 12.9* 12.1* 12.1* 11.9*  HCT 41.5 37.7* 35.8* 35.8* 35.7*  MCV 93.3 96.2 97.0 95.5  97.3  PLT 396 322 350 376 373   Cardiac Enzymes: No results for input(s): CKTOTAL, CKMB, CKMBINDEX, TROPONINI in the last 168 hours. BNP: BNP (last 3 results) No results for input(s): BNP in the last 8760 hours.  ProBNP (last 3 results) No results for input(s): PROBNP in the last 8760 hours.  CBG: Recent Labs  Lab 06/12/21 2059  GLUCAP 110*       Signed:  Myrtie Neither, MD [facility] 06/14/2021, 5:00 PM

## 2021-06-14 NOTE — Discharge Instructions (Signed)
[facility] ______[facility] LAPAROSCOPIC SURGERY: POST OP INSTRUCTIONS Always review your discharge instruction sheet given to you by the facility where your surgery was performed. IF YOU HAVE DISABILITY OR FAMILY LEAVE FORMS, YOU MUST BRING THEM TO THE OFFICE FOR PROCESSING.   DO NOT GIVE THEM TO YOUR DOCTOR.  A prescription for pain medication may be given to you upon discharge.  Take your pain medication as prescribed, if needed.  If narcotic pain medicine is not needed, then you may take acetaminophen (Tylenol) or ibuprofen (Advil) as needed. Take your usually prescribed medications unless otherwise directed. If you need a refill on your pain medication, please contact your pharmacy.  They will contact our office to request authorization. Prescriptions will not be filled after 5pm or on week-ends. You should follow a light diet the first few days after arrival home, such as soup and crackers, etc.  Be sure to include lots of fluids daily. Most patients will experience some swelling and bruising in the area of the incisions.  Ice packs will help.  Swelling and bruising can take several days to resolve.  It is common to experience some constipation if taking pain medication after surgery.  Increasing fluid intake and taking a stool softener (such as Colace) will usually help or prevent this problem from occurring.  A mild laxative (Milk of Magnesia or Miralax) should be taken according to package instructions if there are no bowel movements after 48 hours. Unless discharge instructions indicate otherwise, you may remove your bandages 24-48 hours after surgery, and you may shower at that time.  You may have steri-strips (small skin tapes) in place directly over the incision.  These strips should be left on the skin for 7-10 days.  If your surgeon used skin glue on the incision, you may shower in 24 hours.  The glue will flake off over the next 2-3 weeks.  Any sutures or staples will be  removed at the office during your follow-up visit. ACTIVITIES:  You may resume regular (light) daily activities beginning the next day--such as daily self-care, walking, climbing stairs--gradually increasing activities as tolerated.  You may have sexual intercourse when it is comfortable.  Refrain from any heavy lifting or straining until approved by your doctor. You may drive when you are no longer taking prescription pain medication, you can comfortably wear a seatbelt, and you can safely maneuver your car and apply brakes. RETURN TO WORK:  __________________________________________________________ You should see your doctor in the office for a follow-up appointment approximately 2-3 weeks after your surgery.  Make sure that you call for this appointment within a day or two after you arrive home to insure a convenient appointment time. OTHER INSTRUCTIONS: __________________________________________________________________________________________________________________________ __________________________________________________________________________________________________________________________ WHEN TO CALL YOUR DOCTOR: Fever over 101.0 Inability to urinate Continued bleeding from incision. Increased pain, redness, or drainage from the incision. Increasing abdominal pain  The clinic staff is available to answer your questions during regular business hours.  Please don't hesitate to call and ask to speak to one of the nurses for clinical concerns.  If you have a medical emergency, go to the nearest emergency room or call 911.  A surgeon from [facility] is always on call at the hospital. [address], [address], [place], [place]  [address] ? [address] [address], [place], [place]   [address] [phone] ? [phone] ? FAX [phone] Web site: [email or web address]  

## 2021-06-14 NOTE — Progress Notes (Signed)
Patient discharged to home with instructions, demonstrated to pt. and family member on how to empty his JP drain, verbalized understanding, sent with supplies.

## 2021-06-15 ENCOUNTER — Telehealth: Payer: Self-pay

## 2021-06-15 ENCOUNTER — Other Ambulatory Visit: Payer: Self-pay | Admitting: Gastroenterology

## 2021-06-15 ENCOUNTER — Encounter: Payer: Self-pay | Admitting: Gastroenterology

## 2021-06-15 DIAGNOSIS — K8001 Calculus of gallbladder with acute cholecystitis with obstruction: Secondary | ICD-10-CM

## 2021-06-15 LAB — SURGICAL PATHOLOGY

## 2021-06-15 NOTE — Telephone Encounter (Signed)
Barbaraann Rondo notified patient and placed order. Reminder in epic.

## 2021-06-15 NOTE — Progress Notes (Signed)
Spoke to patient.Result letter sent through MyChart orders placed for 2 week KUB. All questions answered. Patient voiced understanding

## 2021-06-15 NOTE — Telephone Encounter (Signed)
-----   Message from Sammuel Cooper, PA-C sent at 06/14/2021  1:25 PM EDT ----- Regarding: KUB Patty, Dr. Meridee Score may have already messaged you on this but he did an ERCP on this patient yesterday at the hospital and placed a temporary pancreatic duct stent  Patient is being discharged today  He needs orders placed for him to have a KUB-1 view on Friday, 06/25/2021 to ensure the pancreatic duct stent has come out Patient is aware to, on that day please place the orders under Dr. Winifred Olive be a good idea to call patient to remind him early next week

## 2021-06-25 ENCOUNTER — Ambulatory Visit (INDEPENDENT_AMBULATORY_CARE_PROVIDER_SITE_OTHER)
Admission: RE | Admit: 2021-06-25 | Discharge: 2021-06-25 | Disposition: A | Discharge status: Home / Self Care | Payer: Managed Care, Other (non HMO) | Source: Ambulatory Visit | Attending: Gastroenterology | Admitting: Gastroenterology

## 2021-06-25 ENCOUNTER — Other Ambulatory Visit: Payer: Self-pay

## 2021-06-25 DIAGNOSIS — K8001 Calculus of gallbladder with acute cholecystitis with obstruction: Secondary | ICD-10-CM

## 2022-04-22 IMAGING — CT CT ABD-PELV W/ CM
2 of 4 series · 16 of 46 positions shown, 18 images · IV contrast (omnipaque)
Comparison: None.

CLINICAL DATA: Acute abdominal pain and nausea.

EXAM:
CT ABDOMEN AND PELVIS WITH CONTRAST
TECHNIQUE: Multidetector CT imaging of the abdomen and pelvis was performed
using the standard protocol following bolus administration of
intravenous contrast.
CONTRAST:  100mL OMNIPAQUE IOHEXOL 300 MG/ML  SOLN

[Series 2: abd pel w · axial · 0.78mm/px · z∈[-984,-509]mm · 13 of 105 slices shown, 15 images]
[im 5/105  soft-tissue]
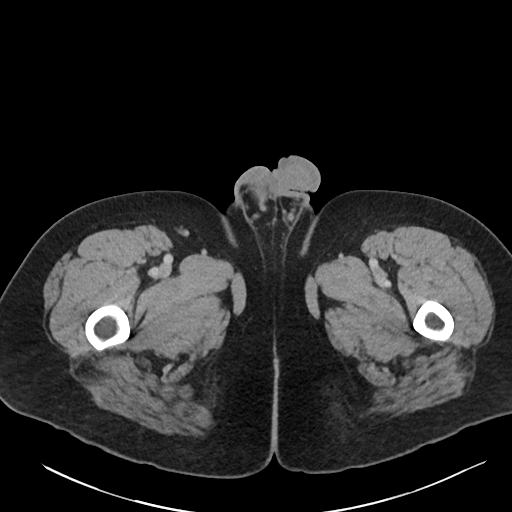
[im 5/105  bone]
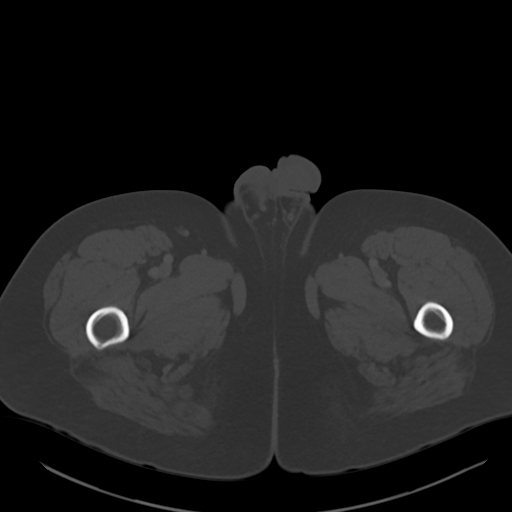
[im 14/105  soft-tissue]
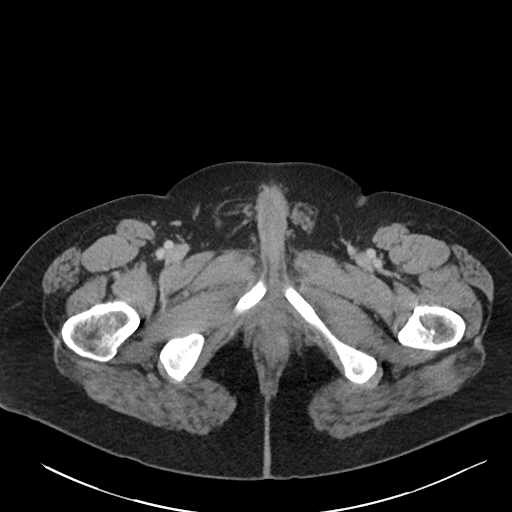
[im 22/105  soft-tissue]
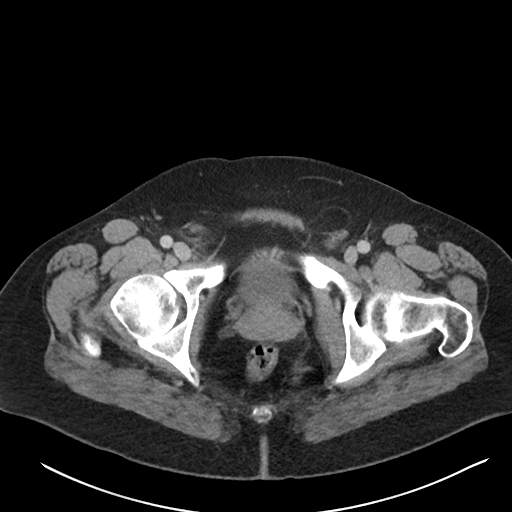
[im 31/105  soft-tissue]
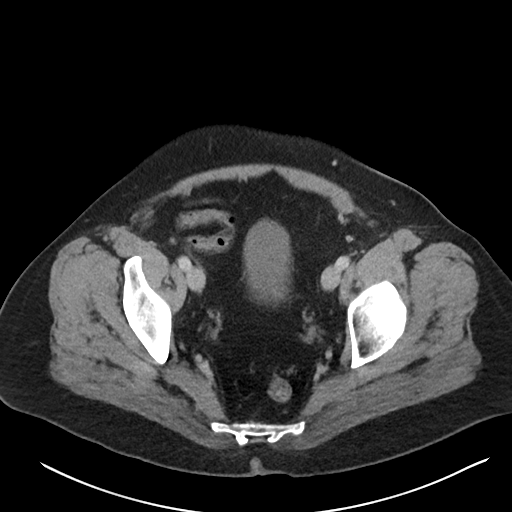
[im 35/105  soft-tissue]
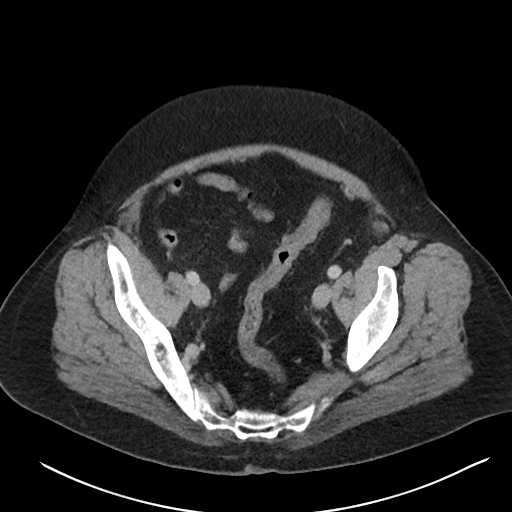
[im 44/105  soft-tissue]
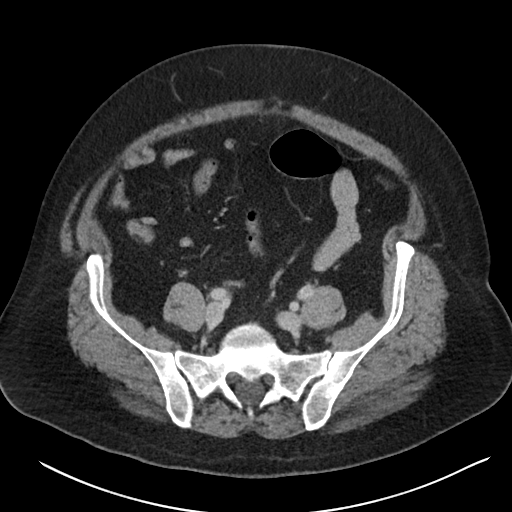
[im 53/105  soft-tissue]
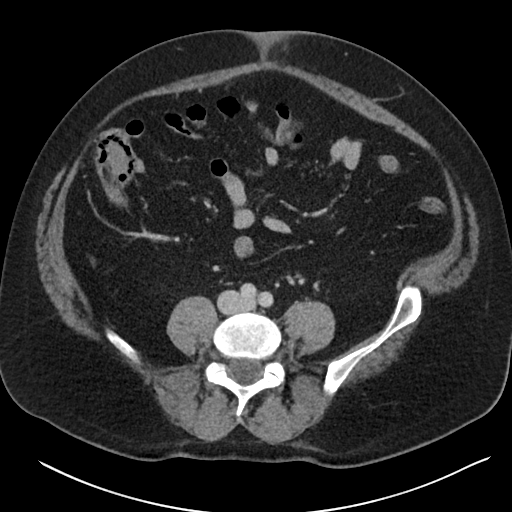
[im 61/105  soft-tissue]
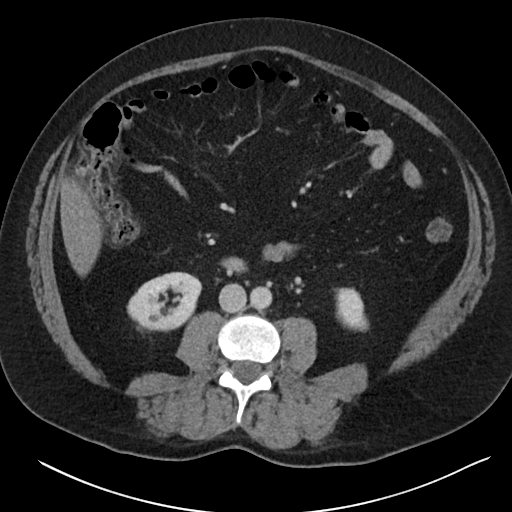
[im 70/105  soft-tissue]
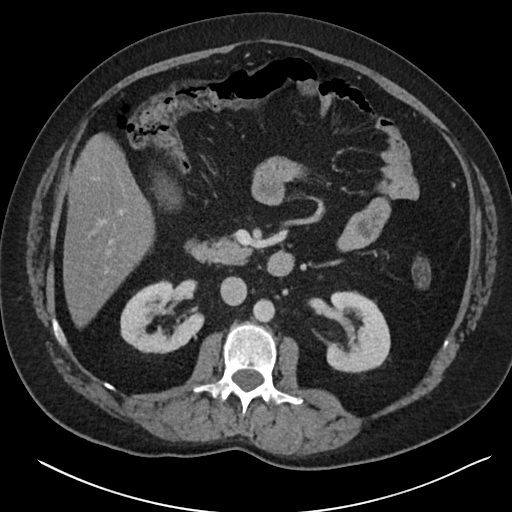
[im 70/105  bone]
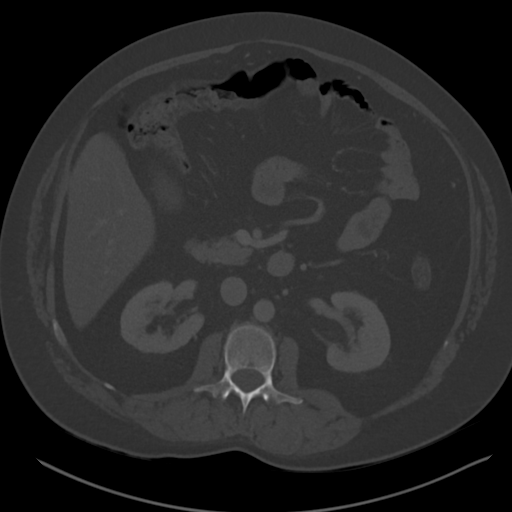
[im 74/105  soft-tissue]
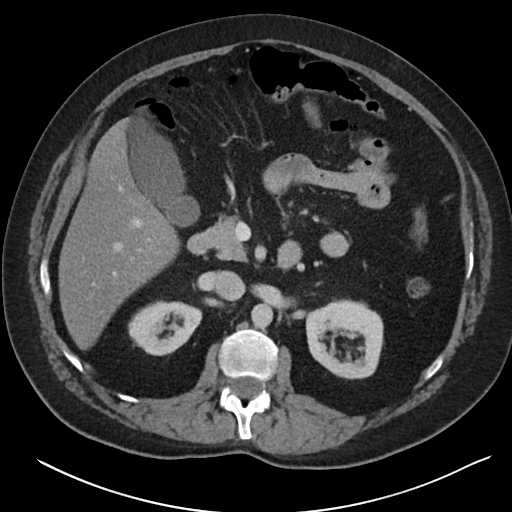
[im 83/105  soft-tissue]
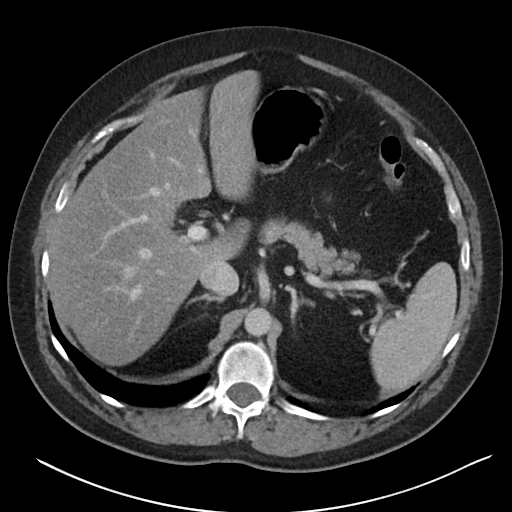
[im 92/105  soft-tissue]
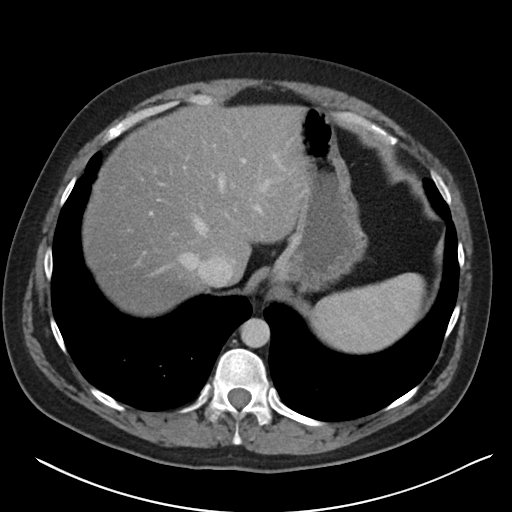
[im 100/105  soft-tissue]
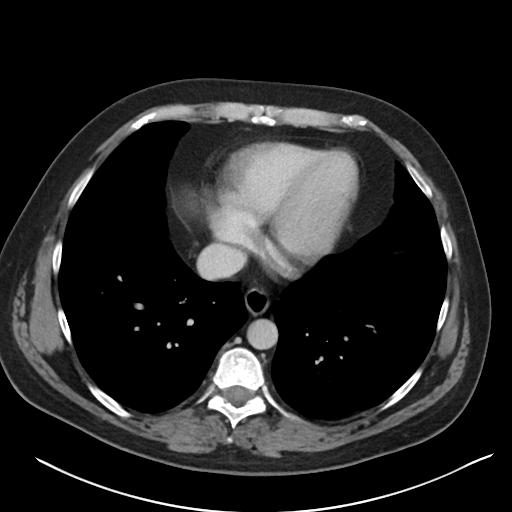

[Series 5: coronal · coronal · 0.73mm/px · 3 of 123 slices shown]
[im 41/123  soft-tissue]
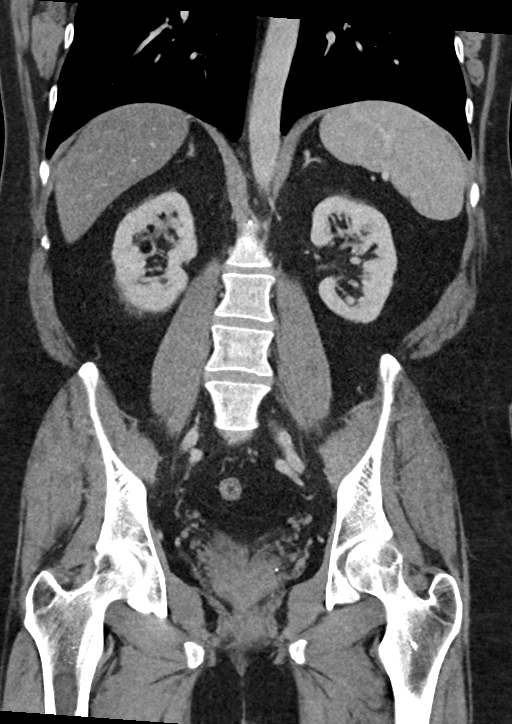
[im 55/123  soft-tissue]
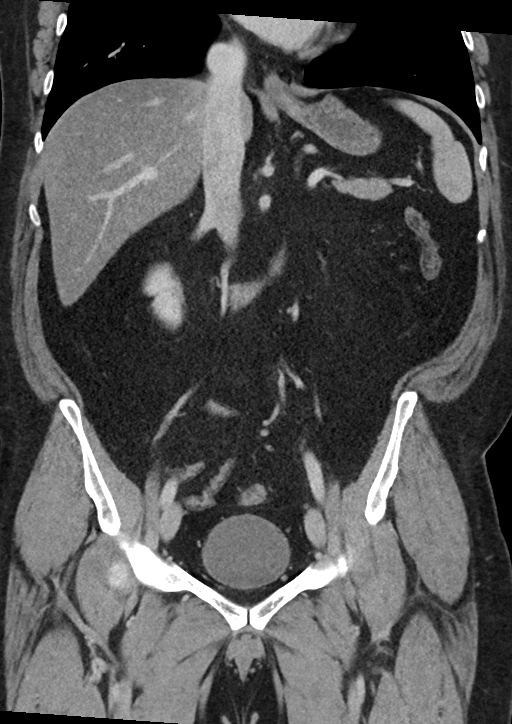
[im 68/123  soft-tissue]
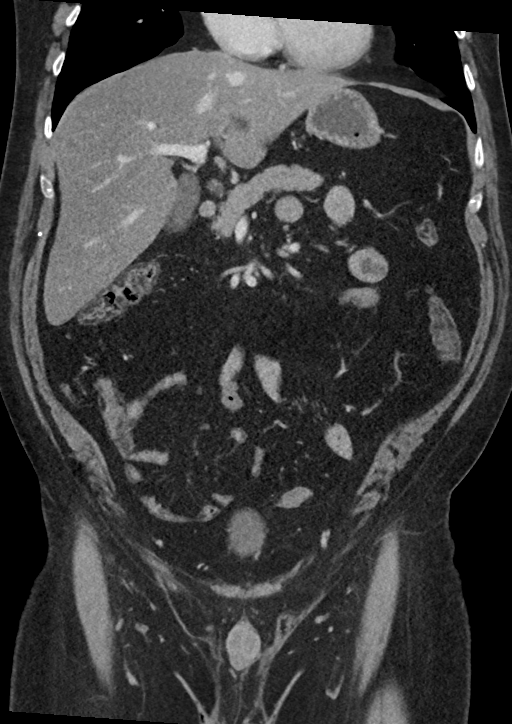

[16 of 46 positions shown; findings below may reference images not displayed]

FINDINGS: Lower chest: Lung bases are clear.

Hepatobiliary: Diffuse fatty infiltration of the liver. No focal
lesions. Gallbladder and bile ducts are unremarkable.

Pancreas: Unremarkable. No pancreatic ductal dilatation or
surrounding inflammatory changes.

Spleen: Normal in size without focal abnormality.

Adrenals/Urinary Tract: Adrenal glands are unremarkable. Kidneys are
normal, without renal calculi, focal lesion, or hydronephrosis.
Bladder is unremarkable.

Stomach/Bowel: Stomach, small bowel, and colon are not abnormally
distended. No wall thickening or inflammatory changes appreciated.
Appendix is normal.

Vascular/Lymphatic: No significant vascular findings are present. No
enlarged abdominal or pelvic lymph nodes.

Reproductive: Prostate is unremarkable.

Other: No free air or free fluid in the abdomen. Small left inguinal
hernia containing fat.

Musculoskeletal: No acute or significant osseous findings.
IMPRESSION: 1. No acute process demonstrated in the abdomen or pelvis. No
evidence of bowel obstruction or inflammation.
2. Diffuse fatty infiltration of the liver.
3. Small left inguinal hernia containing fat.

## 2022-05-11 IMAGING — DX DG ABDOMEN 1V
1 series · 2 of 2 positions shown · non-contrast
Comparison: ERCP images [id number] reviewed.

CLINICAL DATA: ensure the pancreatic duct stent has come out.KUB
due06/25/21

EXAM:
ABDOMEN - 1 VIEW

[Series 2: abdomen kub · 0.14mm/px · 2 of 2 slices shown]
[im 1/2]
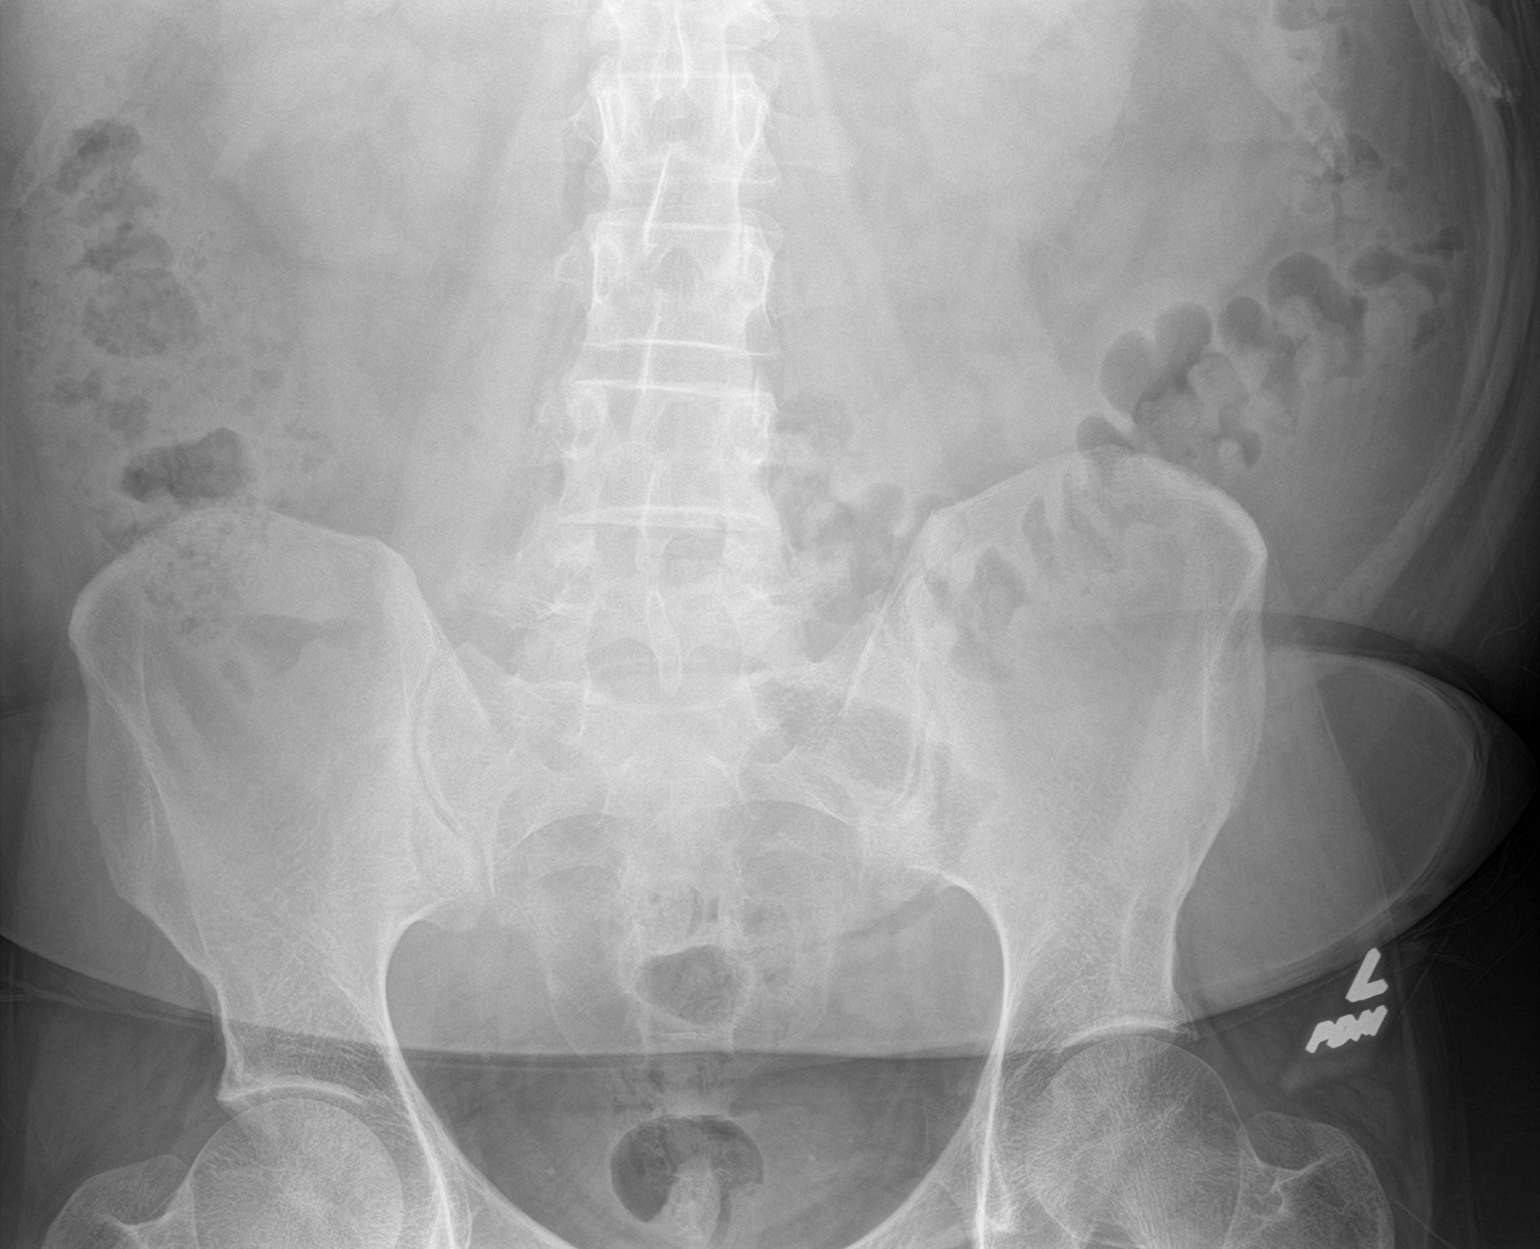
[im 2/2]
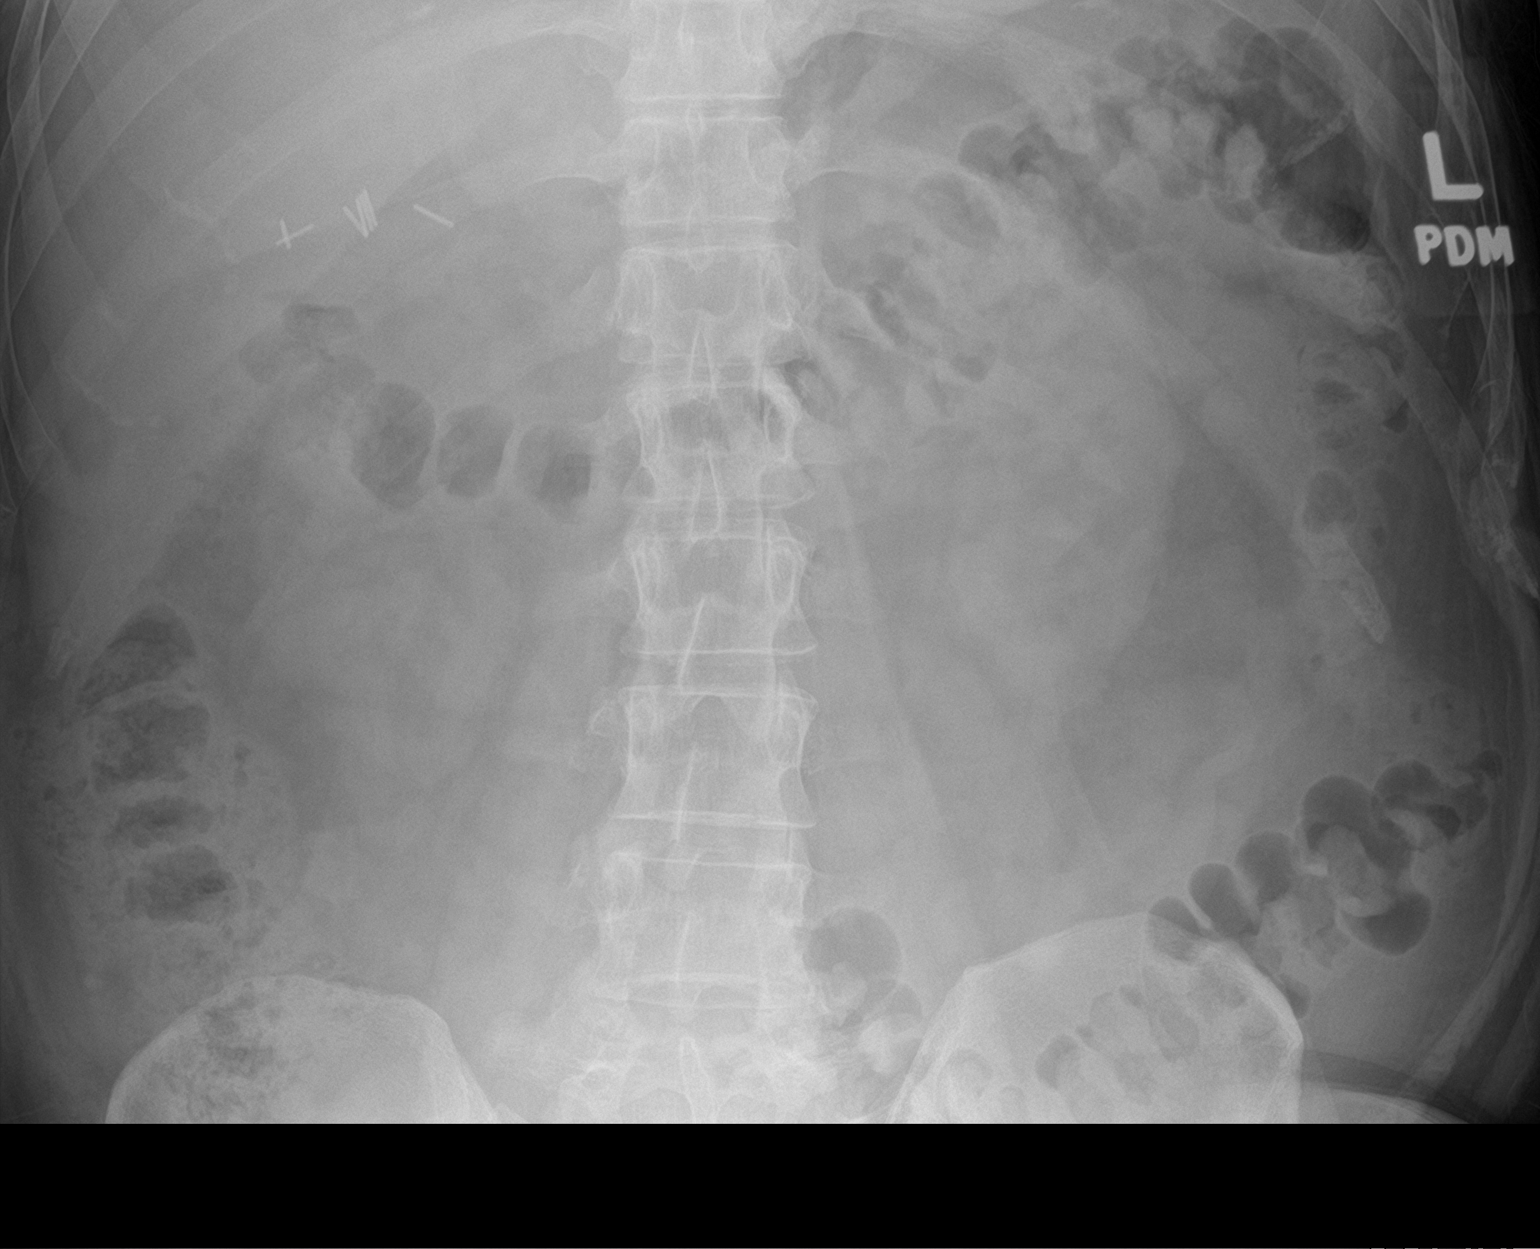

[2 of 2 positions shown; findings below may reference images not displayed]

FINDINGS: No pancreatic or biliary stent is visualized in the abdomen.
Cholecystectomy clips in the right upper quadrant. Normal bowel gas
pattern with small to moderate colonic stool burden. No small bowel
dilatation. No visualized radiopaque calculi. No acute osseous
abnormalities are seen.
IMPRESSION: No pancreatic or biliary stent visualized in the abdomen.
